# Patient Record
Sex: Female | Born: 1955
Health system: Southern US, Community
[De-identification: ages and names within clinical notes are randomized; demographics above are authoritative.]

## PROBLEM LIST (undated history)

## (undated) DIAGNOSIS — K7581 Nonalcoholic steatohepatitis (NASH): Secondary | ICD-10-CM

## (undated) DIAGNOSIS — L439 Lichen planus, unspecified: Secondary | ICD-10-CM

## (undated) DIAGNOSIS — E063 Autoimmune thyroiditis: Principal | ICD-10-CM

## (undated) DIAGNOSIS — E559 Vitamin D deficiency, unspecified: Secondary | ICD-10-CM

## (undated) DIAGNOSIS — E038 Other specified hypothyroidism: Secondary | ICD-10-CM

## (undated) DIAGNOSIS — R748 Abnormal levels of other serum enzymes: Secondary | ICD-10-CM

## (undated) DIAGNOSIS — E049 Nontoxic goiter, unspecified: Secondary | ICD-10-CM

## (undated) HISTORY — DX: Abnormal levels of other serum enzymes: R74.8

## (undated) HISTORY — DX: Vitamin D deficiency, unspecified: E55.9

## (undated) HISTORY — DX: Autoimmune thyroiditis: E03.8

## (undated) HISTORY — DX: Nonalcoholic steatohepatitis (NASH): K75.81

## (undated) HISTORY — DX: Autoimmune thyroiditis: E06.3

## (undated) HISTORY — DX: Lichen planus, unspecified: L43.9

## (undated) HISTORY — DX: Nontoxic goiter, unspecified: E04.9

---

## 1996-10-07 HISTORY — PX: TOTAL ABDOMINAL HYSTERECTOMY W/ BILATERAL SALPINGOOPHORECTOMY: SHX83

## 1998-10-07 HISTORY — PX: CHOLECYSTECTOMY: SHX55

## 2016-02-06 LAB — HM MAMMOGRAPHY

## 2017-12-19 ENCOUNTER — Ambulatory Visit: Payer: Self-pay | Admitting: Family Medicine

## 2017-12-19 ENCOUNTER — Encounter: Payer: Self-pay | Admitting: Family Medicine

## 2017-12-19 ENCOUNTER — Ambulatory Visit: Payer: BLUE CROSS/BLUE SHIELD | Admitting: Family Medicine

## 2017-12-19 VITALS — BP 134/82 | HR 92 | Temp 98.6°F | Ht 63.0 in | Wt 280.2 lb

## 2017-12-19 DIAGNOSIS — E063 Autoimmune thyroiditis: Secondary | ICD-10-CM

## 2017-12-19 DIAGNOSIS — Z1231 Encounter for screening mammogram for malignant neoplasm of breast: Secondary | ICD-10-CM

## 2017-12-19 DIAGNOSIS — L439 Lichen planus, unspecified: Secondary | ICD-10-CM | POA: Diagnosis not present

## 2017-12-19 DIAGNOSIS — E038 Other specified hypothyroidism: Secondary | ICD-10-CM | POA: Diagnosis not present

## 2017-12-19 DIAGNOSIS — R740 Nonspecific elevation of levels of transaminase and lactic acid dehydrogenase [LDH]: Secondary | ICD-10-CM | POA: Diagnosis not present

## 2017-12-19 DIAGNOSIS — R748 Abnormal levels of other serum enzymes: Secondary | ICD-10-CM

## 2017-12-19 DIAGNOSIS — E049 Nontoxic goiter, unspecified: Secondary | ICD-10-CM

## 2017-12-19 DIAGNOSIS — K7581 Nonalcoholic steatohepatitis (NASH): Secondary | ICD-10-CM | POA: Diagnosis not present

## 2017-12-19 DIAGNOSIS — F329 Major depressive disorder, single episode, unspecified: Secondary | ICD-10-CM | POA: Diagnosis not present

## 2017-12-19 DIAGNOSIS — F32A Depression, unspecified: Secondary | ICD-10-CM | POA: Insufficient documentation

## 2017-12-19 DIAGNOSIS — Z1239 Encounter for other screening for malignant neoplasm of breast: Secondary | ICD-10-CM

## 2017-12-19 HISTORY — DX: Depression, unspecified: F32.A

## 2017-12-19 NOTE — Progress Notes (Signed)
Subjective:    Patient ID: Krista Kelley, female    DOB: [birth year], 62 y.o.   MRN: [id number]  HPI Chief Complaint  Patient presents with  . other    establish care   She is new to the practice and here for a complete physical exam. Previous medical care: in [place], [place] for 5 months. [place] prior to that. Living in [place] now.  Last CPE: less than a year ago  Other providers: none  States she has history of elevated liver enzymes, NASH  States she was diagnosed with Hashimoto's thyroiditis in 2004 and started treatment in 2016. States she had an [place] of thyroid in 2004.  States she thinks her thyroid has enlarged and she occasionally has discomfort when swallowing and thinks it is related to her thyroid.  Reports her dermatologist in [place] diagnosed her with lichen planus on her arms and in her mouth only occasionally- takes Zyrtec for itching.  States her hands, feet and legs hurt when her lichen planus flares up. Once every 4-6 weeks. Flare up lasts 1-2 weeks.   Takes Effexor for depression and has been on this medication since 1994.  States her mood is good  Denies fever, chills, dizziness, chest pain, palpitations, shortness of breath, abdominal pain, N/V/D, urinary symptoms, LE edema.    Social history: Lives with her husband and son who is 33, worked as a Garment/textile technologist alcohol rarely.   States she knows she needs to lose weight.  States her previous PCP mentioned this often.  Her diet has been the same for years as well has her weight.  She does not exercise.   Health maintenance:  Mammogram: last one in 2017. Overdue.  Requests that I send her for mammogram. Colonoscopy: never and she does not want to have a colonoscopy. Last Gynecological Exam: hysterectomy.  Pelvic exam less than a year ago.   Reviewed allergies, medications, past medical, surgical, family, and social history.    Review of Systems Pertinent positives and negatives in the history of  present illness.     Objective:   Physical Exam BP 134/82 (BP Location: Left Arm, Patient Position: Sitting)   Pulse 92   Temp 98.6 F (37 C)   Ht 5' 3"  (1.6 m)   Wt 280 lb 3.2 oz (127.1 kg)   SpO2 94%   BMI 49.64 kg/m   Alert and in no distress. Marland Kitchen Pharyngeal area is normal. Neck is supple without adenopathy. Thyroid is enlarged, no obvious nodules, nontender. Cardiac exam shows a regular sinus rhythm without murmurs or gallops. Lungs are clear to auscultation.  Extremities without edema, normal pulses.  Skin is warm and dry, no pallor or jaundice.  PERRLA, normal conjunctiva.       Assessment & Plan:  /Hypothyroidism due to Hashimoto's thyroiditis - Plan: CBC with Differential/Platelet, Comprehensive metabolic panel, T4, free, TSH, US THYROID  Lichen planus  Enlarged thyroid - Plan: CBC with Differential/Platelet, T4, free, TSH, US THYROID  NASH (nonalcoholic steatohepatitis) - Plan: CBC with Differential/Platelet, Comprehensive metabolic panel  Elevated liver enzymes - Plan: Comprehensive metabolic panel  Depression, unspecified depression type - Plan: CBC with Differential/Platelet, Comprehensive metabolic panel  Screening for breast cancer - Plan: MM DIGITAL SCREENING BILATERAL  Morbid obesity ([place])  Plan to send her for thyroid ultrasound and check thyroid panel due to Hashimoto's thyroiditis history and enlarged thyroid.  She feels like her thyroid has increased in size and now notices issues with swallowing.  If needed we  will refer her to endocrinology.  Once I have her thyroid panel results are will refill Synthroid and adjust medication as appropriate. History of [place] and elevated liver enzymes.   Counseled her on healthy diet and weight loss.  She does not exercise.  Discussed that she can lose weight by cutting back calories and carbohydrates even without exercise.  She does not drink. Depression appears to be well controlled and she has been on Effexor for many  years without any issues.  She will continue this. She had a CPE in [place] within the past year however she moved prior to getting her mammogram.  I will send her for a mammogram. She will sign a medical release today and we will attempt to get medical records from providers in [place]. We will address lichen planus at follow-up visit.  Consider referral to dermatologist if she continues having flareups. We will check labs and follow-up. Spent at least 45 minutes face-to-face with patient and 50% or greater was in coordination of care and counseling.

## 2017-12-19 NOTE — Patient Instructions (Signed)
Call and schedule your mammogram.   We will call you with your lab results.

## 2017-12-20 LAB — CBC WITH DIFFERENTIAL/PLATELET
Basophils Absolute: 0.1 10*3/uL (ref 0.0–0.2)
Basos: 1 %
EOS (ABSOLUTE): 0.3 10*3/uL (ref 0.0–0.4)
Eos: 4 %
Hematocrit: 43.8 % (ref 34.0–46.6)
Hemoglobin: 14.8 g/dL (ref 11.1–15.9)
Immature Grans (Abs): 0 10*3/uL (ref 0.0–0.1)
Immature Granulocytes: 0 %
Lymphocytes Absolute: 1.9 10*3/uL (ref 0.7–3.1)
Lymphs: 25 %
MCH: 29.7 pg (ref 26.6–33.0)
MCHC: 33.8 g/dL (ref 31.5–35.7)
MCV: 88 fL (ref 79–97)
Monocytes Absolute: 0.5 10*3/uL (ref 0.1–0.9)
Monocytes: 6 %
Neutrophils Absolute: 4.9 10*3/uL (ref 1.4–7.0)
Neutrophils: 64 %
Platelets: 170 10*3/uL (ref 150–379)
RBC: 4.98 x10E6/uL (ref 3.77–5.28)
RDW: 14 % (ref 12.3–15.4)
WBC: 7.6 10*3/uL (ref 3.4–10.8)

## 2017-12-20 LAB — COMPREHENSIVE METABOLIC PANEL
ALT: 33 IU/L — ABNORMAL HIGH (ref 0–32)
AST: 36 IU/L (ref 0–40)
Albumin/Globulin Ratio: 1.4 (ref 1.2–2.2)
Albumin: 4.2 g/dL (ref 3.5–5.5)
Alkaline Phosphatase: 104 IU/L (ref 39–117)
BUN/Creatinine Ratio: 15 (ref 9–23)
BUN: 9 mg/dL (ref 6–24)
Bilirubin Total: 0.9 mg/dL (ref 0.0–1.2)
CO2: 23 mmol/L (ref 20–29)
Calcium: 9.9 mg/dL (ref 8.7–10.2)
Chloride: 105 mmol/L (ref 96–106)
Creatinine, Ser: 0.6 mg/dL (ref 0.57–1.00)
GFR calc Af Amer: 115 mL/min/{1.73_m2} (ref >59)
GFR calc non Af Amer: 100 mL/min/{1.73_m2} (ref >59)
Globulin, Total: 3 g/dL (ref 1.5–4.5)
Glucose: 92 mg/dL (ref 65–99)
Potassium: 4 mmol/L (ref 3.5–5.2)
Sodium: 146 mmol/L — ABNORMAL HIGH (ref 134–144)
Total Protein: 7.2 g/dL (ref 6.0–8.5)

## 2017-12-20 LAB — TSH: TSH: 3.09 u[IU]/mL (ref 0.450–4.500)

## 2017-12-20 LAB — T4, FREE: Free T4: 1.26 ng/dL (ref 0.82–1.77)

## 2017-12-24 ENCOUNTER — Telehealth: Payer: Self-pay | Admitting: Family Medicine

## 2017-12-24 ENCOUNTER — Ambulatory Visit (HOSPITAL_COMMUNITY): Payer: BLUE CROSS/BLUE SHIELD

## 2017-12-24 MED ORDER — LEVOTHYROXINE SODIUM 50 MCG PO TABS: 50.0000 ug | ORAL_TABLET | Freq: Every day | ORAL | 1 refills | Status: DC

## 2017-12-24 NOTE — Telephone Encounter (Signed)
New Message  Pt verbalized she is wanting for nurse to give her a call.  Please f/u

## 2017-12-24 NOTE — Telephone Encounter (Signed)
Pt called to state that she has an 2500 deducitble and can not have her thyroid us done right now. Pt asked for a refill on thryoid med which I sent to pharmacy

## 2017-12-25 ENCOUNTER — Encounter: Payer: Self-pay | Admitting: Family Medicine

## 2017-12-25 ENCOUNTER — Telehealth: Payer: Self-pay | Admitting: Family Medicine

## 2017-12-25 LAB — SPECIMEN STATUS REPORT

## 2017-12-25 LAB — HEPATITIS PANEL, ACUTE
Hep A IgM: NEGATIVE
Hep B C IgM: NEGATIVE
Hep C Virus Ab: <0.1 s/co ratio (ref 0.0–0.9)
Hepatitis B Surface Ag: NEGATIVE

## 2017-12-25 MED ORDER — VENLAFAXINE HCL 75 MG PO TABS: ORAL_TABLET | ORAL | 1 refills | Status: DC

## 2017-12-25 NOTE — Telephone Encounter (Signed)
Received records from [facility]. Only received small amount with no colonoscopy. Krista Kelley was called to inquire and was informed that pt was only seen once for a office visit and never received a colonoscopy at that facility. Sending back records received for review.

## 2017-12-29 ENCOUNTER — Telehealth: Payer: Self-pay | Admitting: Family Medicine

## 2017-12-29 NOTE — Telephone Encounter (Signed)
Requested records from [facility] received.Sending back for review.

## 2017-12-31 ENCOUNTER — Telehealth: Payer: Self-pay | Admitting: Family Medicine

## 2017-12-31 NOTE — Telephone Encounter (Signed)
Received requested records from Dr. Awilda Metro, Dermatology. Sending back for review.

## 2018-01-02 ENCOUNTER — Encounter: Payer: Self-pay | Admitting: Gastroenterology

## 2018-01-02 ENCOUNTER — Other Ambulatory Visit: Payer: Self-pay | Admitting: Family Medicine

## 2018-01-02 DIAGNOSIS — K7581 Nonalcoholic steatohepatitis (NASH): Secondary | ICD-10-CM

## 2018-01-02 DIAGNOSIS — R748 Abnormal levels of other serum enzymes: Secondary | ICD-10-CM

## 2018-01-05 ENCOUNTER — Encounter: Payer: Self-pay | Admitting: Family Medicine

## 2018-01-08 ENCOUNTER — Ambulatory Visit
Admission: RE | Admit: 2018-01-08 | Discharge: 2018-01-08 | Disposition: A | Discharge status: Home / Self Care | Payer: BLUE CROSS/BLUE SHIELD | Source: Ambulatory Visit | Attending: Family Medicine | Admitting: Family Medicine

## 2018-01-08 DIAGNOSIS — Z1239 Encounter for other screening for malignant neoplasm of breast: Secondary | ICD-10-CM

## 2018-01-08 DIAGNOSIS — Z1231 Encounter for screening mammogram for malignant neoplasm of breast: Secondary | ICD-10-CM | POA: Diagnosis not present

## 2018-01-11 ENCOUNTER — Encounter: Payer: Self-pay | Admitting: Family Medicine

## 2018-01-16 ENCOUNTER — Encounter: Payer: Self-pay | Admitting: Family Medicine

## 2018-01-19 ENCOUNTER — Encounter: Payer: Self-pay | Admitting: Family Medicine

## 2018-01-27 ENCOUNTER — Other Ambulatory Visit: Payer: Self-pay | Admitting: Family Medicine

## 2018-01-27 DIAGNOSIS — R928 Other abnormal and inconclusive findings on diagnostic imaging of breast: Secondary | ICD-10-CM

## 2018-02-02 ENCOUNTER — Ambulatory Visit
Admission: RE | Admit: 2018-02-02 | Discharge: 2018-02-02 | Disposition: A | Discharge status: Home / Self Care | Payer: BLUE CROSS/BLUE SHIELD | Source: Ambulatory Visit | Attending: Family Medicine | Admitting: Family Medicine

## 2018-02-02 ENCOUNTER — Other Ambulatory Visit: Payer: Self-pay | Admitting: Family Medicine

## 2018-02-02 DIAGNOSIS — R928 Other abnormal and inconclusive findings on diagnostic imaging of breast: Secondary | ICD-10-CM | POA: Diagnosis not present

## 2018-02-02 DIAGNOSIS — N6310 Unspecified lump in the right breast, unspecified quadrant: Secondary | ICD-10-CM

## 2018-02-16 ENCOUNTER — Ambulatory Visit: Payer: BLUE CROSS/BLUE SHIELD | Admitting: Gastroenterology

## 2018-03-17 ENCOUNTER — Encounter: Payer: Self-pay | Admitting: Family Medicine

## 2018-06-22 ENCOUNTER — Other Ambulatory Visit: Payer: Self-pay | Admitting: Family Medicine

## 2018-07-30 ENCOUNTER — Telehealth: Payer: Self-pay | Admitting: Family Medicine

## 2018-07-30 NOTE — Telephone Encounter (Signed)
Pt called for refills on Effexor. Due to insurance needs to be 75 mg 3x daily. She would like to change to extended release. So she is requested venlafaxine extended release 75 mg 3 times daily. Please send to [facility]. Pt can be reached at [phone].

## 2018-07-30 NOTE — Telephone Encounter (Signed)
Please take care of this however, XR is only once daily dosing and not three times daily. She will need a follow up in the next 3 months, I have only seen her once in March.

## 2018-07-30 NOTE — Telephone Encounter (Signed)
Pt will come in Monday to have a med check and discuss med and then possible UTI

## 2018-08-03 ENCOUNTER — Encounter: Payer: Self-pay | Admitting: Family Medicine

## 2018-08-03 ENCOUNTER — Ambulatory Visit: Payer: BLUE CROSS/BLUE SHIELD | Admitting: Family Medicine

## 2018-08-03 ENCOUNTER — Other Ambulatory Visit: Payer: Self-pay

## 2018-08-03 ENCOUNTER — Encounter: Payer: Self-pay | Admitting: *Deleted

## 2018-08-03 VITALS — BP 140/90 | HR 89 | Temp 98.1°F | Resp 16 | Wt 278.2 lb

## 2018-08-03 DIAGNOSIS — F329 Major depressive disorder, single episode, unspecified: Secondary | ICD-10-CM | POA: Diagnosis not present

## 2018-08-03 DIAGNOSIS — E063 Autoimmune thyroiditis: Secondary | ICD-10-CM

## 2018-08-03 DIAGNOSIS — R350 Frequency of micturition: Secondary | ICD-10-CM

## 2018-08-03 DIAGNOSIS — E038 Other specified hypothyroidism: Secondary | ICD-10-CM | POA: Diagnosis not present

## 2018-08-03 DIAGNOSIS — Z91030 Bee allergy status: Secondary | ICD-10-CM

## 2018-08-03 DIAGNOSIS — M545 Low back pain, unspecified: Secondary | ICD-10-CM

## 2018-08-03 DIAGNOSIS — K7581 Nonalcoholic steatohepatitis (NASH): Secondary | ICD-10-CM

## 2018-08-03 DIAGNOSIS — R748 Abnormal levels of other serum enzymes: Secondary | ICD-10-CM | POA: Diagnosis not present

## 2018-08-03 DIAGNOSIS — E559 Vitamin D deficiency, unspecified: Secondary | ICD-10-CM

## 2018-08-03 DIAGNOSIS — F32A Depression, unspecified: Secondary | ICD-10-CM

## 2018-08-03 DIAGNOSIS — E870 Hyperosmolality and hypernatremia: Secondary | ICD-10-CM

## 2018-08-03 HISTORY — DX: Vitamin D deficiency, unspecified: E55.9

## 2018-08-03 LAB — POCT URINALYSIS DIP (PROADVANTAGE DEVICE)
Bilirubin, UA: NEGATIVE
Blood, UA: NEGATIVE
Glucose, UA: NEGATIVE mg/dL
Ketones, POC UA: NEGATIVE mg/dL
Leukocytes, UA: NEGATIVE
Nitrite, UA: NEGATIVE
Protein Ur, POC: NEGATIVE mg/dL
Specific Gravity, Urine: 1.025
pH, UA: 6 (ref 5.0–8.0)

## 2018-08-03 MED ORDER — EPINEPHRINE 0.3 MG/0.3ML IJ SOAJ: 0.3000 mg | Freq: Once | INTRAMUSCULAR | 0 refills | Status: DC

## 2018-08-03 MED ORDER — VENLAFAXINE HCL 75 MG PO TABS: 75.0000 mg | ORAL_TABLET | Freq: Two times a day (BID) | ORAL | 0 refills | Status: DC

## 2018-08-03 MED ORDER — EPINEPHRINE 0.3 MG/0.3ML IJ SOAJ: 0.3000 mg | Freq: Once | INTRAMUSCULAR | 0 refills | Status: AC

## 2018-08-03 MED ORDER — VENLAFAXINE HCL 37.5 MG PO TABS: 37.5000 mg | ORAL_TABLET | Freq: Two times a day (BID) | ORAL | 0 refills | Status: DC

## 2018-08-03 NOTE — Progress Notes (Signed)
Subjective:    Patient ID: Krista Kelley, female    DOB: [birth year], 62 y.o.   MRN: [id number]  HPI Chief Complaint  Patient presents with  . med check    med check and UTI- frequency, stomach pain, low back pain- gotten better   She is here for a medication check.   Depression- reports being on Effexor for years and stopped it cold [place] 1 1/2 years. States this did not go well so she started back on it. Would like to wean off the medication.  States she tried Zoloft, Lexapro, Wellbutrin.  Has never been in counseling in the past. Is not opposed to it.   States last week she had lower abdominal pain, urinary frequency, urgency.  Symptoms have improved significantly since then.   States she loves juice and soda. Drinks plenty of water as well.   History of hypothyroidism and is taking daily levothyroxine on an empty stomach. No side effects. Needs recheck.   History of elevated LFTs and NASH. Diagnosed by her GI out of state prior to moving here. Has not yet established with GI here.  Denies alcohol or NSAID use.  Denies fever, chills, dizziness, chest pain, palpitations, shortness of breath,  N/V/D, LE edema.   Rash and itching of bilateral forearm. Is seeing a dermatologist. [facility]. Coy Saunas at [facility].  Joint pain and forearm rash once monthly. Lasting 4-5 days.  No joint swelling.   Requests vitamin D testing. Is taking a supplement due to vitamin D def in past.   Carries an epi-pen for bee allergy and requests refill.  Reviewed allergies, medications, past medical, surgical, family, and social history.   Review of Systems Pertinent positives and negatives in the history of present illness.     Objective:   Physical Exam BP 140/90   Pulse 89   Temp 98.1 F (36.7 C) (Oral)   Resp 16   Wt 278 lb 3.2 oz (126.2 kg)   SpO2 98%   BMI 49.28 kg/m   Alert and in no distress.  Pharyngeal area is normal. Neck is supple without adenopathy or thyromegaly. Cardiac exam shows a  regular sinus rhythm without murmurs or gallops. Lungs are clear to auscultation. Abdomen is soft, obese, unable to perform a full exam due to body habitus. Extremities without edema, pulses intact. Skin is warm and dry, no jaundice or pallor. Sclera anicteric. PERRLA, EOMs intact. CNs intact.        Assessment & Plan:  Depression, unspecified depression type  Frequency of urination - Plan: POCT Urinalysis DIP (Proadvantage Device)  Right-sided low back pain without sciatica, unspecified chronicity - Plan: POCT Urinalysis DIP (Proadvantage Device)  Hypothyroidism due to Hashimoto's thyroiditis - Plan: CBC with Differential/Platelet, Comprehensive metabolic panel, TSH  Vitamin D deficiency - Plan: VITAMIN D 25 Hydroxy (Vit-D Deficiency, Fractures)  NASH (nonalcoholic steatohepatitis) - Plan: Hemoglobin A1c  Morbid obesity ([place]) - Plan: CBC with Differential/Platelet, Comprehensive metabolic panel, VITAMIN D 25 Hydroxy (Vit-D Deficiency, Fractures), Hemoglobin A1c, Lipid panel  Elevated liver enzymes - Plan: CBC with Differential/Platelet, Comprehensive metabolic panel, Lipid panel  Serum sodium elevated - Plan: Comprehensive metabolic panel  Bee sting allergy - Plan: DISCONTINUED: EPINEPHrine 0.3 mg/0.3 mL IJ SOAJ injection  She would like to wean off Effexor therefore we are prescribing both the 75 mg and 37.5 mg doses for her. Counseling done on how to wean off and a list of therapists was given. She agrees to call and schedule.  Does not appear to  be any danger to herself or others.  UA unremarkable and symptoms have resolved. No further testing needed.  Hypothyroidism- recheck of thyroid function. Adjust dose as needed.  Vitamin D def- check vitamin D level and adjust medication as appropriate.  Morbid obesity- counseling on healthy diet and exercise. She needs more aggressive treatment at this time. Interested in [place] weight loss study and they will contact her. Also  discussed options for long term medication, Saxenda or referral to [facility]. She will let me know.  NASH- recheck hepatic function. Advised to establish with GI or send her for a follow up US. She prefers to see GI before having another [place].  Follow up pending labs.  Prescription given for epi-pens due to allergy.  Spent at least 40 minutes face to face with patient and more than 50% was in counseling and coordination of care.

## 2018-08-03 NOTE — Patient Instructions (Addendum)
You may start weaning off the Effexor if you would like as discussed. You could start with 75 mg twice daily for 1 to 2 weeks, then 75 mg in the morning and 37.5 mg in the evening. You could then switch to 37.5 mg twice daily for a week and then cut back to once daily dosing.  The me know if you have any questions. I think it would be a great idea for you to call and schedule with a counselor.  We will call you with your lab results.  You will receive a phone call from [facility].  If you decide you do not want to be a part of the study then let me know and we will try an alternative plan for weight loss.  You can call to schedule your appointment with the counselor. A few offices are listed below for you to call.      [facility] [address] [place], [place] [address] [phone]   [facility]  [place], [place], [place] [address]  Phone: [phone]   [place] [address] [place], [place] [address]  Phone: [phone]  [facility]  [address] [place], [place] [address] Phone: [phone]

## 2018-08-04 ENCOUNTER — Encounter: Payer: Self-pay | Admitting: Family Medicine

## 2018-08-04 LAB — CBC WITH DIFFERENTIAL/PLATELET
Basophils Absolute: 0.1 10*3/uL (ref 0.0–0.2)
Basos: 1 %
EOS (ABSOLUTE): 0.2 10*3/uL (ref 0.0–0.4)
Eos: 3 %
Hematocrit: 42.3 % (ref 34.0–46.6)
Hemoglobin: 15.1 g/dL (ref 11.1–15.9)
Immature Grans (Abs): 0 10*3/uL (ref 0.0–0.1)
Immature Granulocytes: 0 %
Lymphocytes Absolute: 1.5 10*3/uL (ref 0.7–3.1)
Lymphs: 20 %
MCH: 30.9 pg (ref 26.6–33.0)
MCHC: 35.7 g/dL (ref 31.5–35.7)
MCV: 87 fL (ref 79–97)
Monocytes Absolute: 0.7 10*3/uL (ref 0.1–0.9)
Monocytes: 9 %
Neutrophils Absolute: 4.9 10*3/uL (ref 1.4–7.0)
Neutrophils: 67 %
Platelets: 174 10*3/uL (ref 150–450)
RBC: 4.88 x10E6/uL (ref 3.77–5.28)
RDW: 13.1 % (ref 12.3–15.4)
WBC: 7.3 10*3/uL (ref 3.4–10.8)

## 2018-08-04 LAB — COMPREHENSIVE METABOLIC PANEL
ALT: 24 IU/L (ref 0–32)
AST: 31 IU/L (ref 0–40)
Albumin/Globulin Ratio: 1.3 (ref 1.2–2.2)
Albumin: 4.1 g/dL (ref 3.6–4.8)
Alkaline Phosphatase: 91 IU/L (ref 39–117)
BUN/Creatinine Ratio: 18 (ref 12–28)
BUN: 10 mg/dL (ref 8–27)
Bilirubin Total: 0.8 mg/dL (ref 0.0–1.2)
CO2: 21 mmol/L (ref 20–29)
Calcium: 9.5 mg/dL (ref 8.7–10.3)
Chloride: 106 mmol/L (ref 96–106)
Creatinine, Ser: 0.56 mg/dL — ABNORMAL LOW (ref 0.57–1.00)
GFR calc Af Amer: 117 mL/min/{1.73_m2} (ref >59)
GFR calc non Af Amer: 102 mL/min/{1.73_m2} (ref >59)
Globulin, Total: 3.2 g/dL (ref 1.5–4.5)
Glucose: 72 mg/dL (ref 65–99)
Potassium: 3.8 mmol/L (ref 3.5–5.2)
Sodium: 143 mmol/L (ref 134–144)
Total Protein: 7.3 g/dL (ref 6.0–8.5)

## 2018-08-04 LAB — TSH: TSH: 3 u[IU]/mL (ref 0.450–4.500)

## 2018-08-04 LAB — HEMOGLOBIN A1C
Est. average glucose Bld gHb Est-mCnc: 111 mg/dL
Hgb A1c MFr Bld: 5.5 % (ref 4.8–5.6)

## 2018-08-04 LAB — LIPID PANEL
Chol/HDL Ratio: 4.5 ratio — ABNORMAL HIGH (ref 0.0–4.4)
Cholesterol, Total: 196 mg/dL (ref 100–199)
HDL: 44 mg/dL (ref >39)
LDL Calculated: 129 mg/dL — ABNORMAL HIGH (ref 0–99)
Triglycerides: 117 mg/dL (ref 0–149)
VLDL Cholesterol Cal: 23 mg/dL (ref 5–40)

## 2018-08-04 LAB — VITAMIN D 25 HYDROXY (VIT D DEFICIENCY, FRACTURES): Vit D, 25-Hydroxy: 31.1 ng/mL (ref 30.0–100.0)

## 2018-08-04 MED ORDER — VENLAFAXINE HCL ER 37.5 MG PO CP24: ORAL_CAPSULE | ORAL | 0 refills | Status: DC

## 2018-08-04 MED ORDER — VENLAFAXINE HCL ER 75 MG PO CP24: ORAL_CAPSULE | ORAL | 0 refills | Status: DC

## 2018-08-05 ENCOUNTER — Ambulatory Visit
Admission: RE | Admit: 2018-08-05 | Discharge: 2018-08-05 | Disposition: A | Discharge status: Home / Self Care | Payer: BLUE CROSS/BLUE SHIELD | Source: Ambulatory Visit | Attending: Family Medicine | Admitting: Family Medicine

## 2018-08-05 ENCOUNTER — Other Ambulatory Visit: Payer: Self-pay | Admitting: Family Medicine

## 2018-08-05 DIAGNOSIS — N6310 Unspecified lump in the right breast, unspecified quadrant: Secondary | ICD-10-CM

## 2018-08-05 DIAGNOSIS — N6011 Diffuse cystic mastopathy of right breast: Secondary | ICD-10-CM | POA: Diagnosis not present

## 2018-08-05 DIAGNOSIS — R928 Other abnormal and inconclusive findings on diagnostic imaging of breast: Secondary | ICD-10-CM | POA: Diagnosis not present

## 2018-08-28 ENCOUNTER — Telehealth: Payer: Self-pay | Admitting: Family Medicine

## 2018-08-28 MED ORDER — VENLAFAXINE HCL ER 75 MG PO CP24: ORAL_CAPSULE | ORAL | 1 refills | Status: DC

## 2018-08-28 NOTE — Telephone Encounter (Signed)
Spoke to patient and she is taking 37.5mg  and doing great on it but a couple times a month see needs an extra dose due to stress.  Per vickie, med doesn't work like that and we can just up her to 75mg  XR if she feels that this can help her. She can not take any extra dose or cut them in half. If she continues to have stress then she will need to be seen or visit a counselor to talk about stress. I have send in 75mg  xr capsule to pharmacy.

## 2018-08-28 NOTE — Telephone Encounter (Signed)
Refill 37.39m? And pt is still to take it every other day or take it every day?

## 2018-08-28 NOTE — Telephone Encounter (Signed)
Left message for pt to call me back  Per notes, she is suppose to being weaning off med.

## 2018-08-28 NOTE — Telephone Encounter (Signed)
Please advise 

## 2018-08-28 NOTE — Telephone Encounter (Signed)
Please clarify how she is taking it but if she is not weaning off she should be taking it daily. Ask whether 37.5 mg XR or 75 mg XR

## 2018-08-28 NOTE — Telephone Encounter (Signed)
Please call and clarify what she needs and take care of this. Thanks.

## 2018-08-28 NOTE — Telephone Encounter (Signed)
Pt called and is requesting to a refill on her Effexor XR she would like to get it to where she takes 2 37.5 a day, states she is doing good on the 37.5 but some days she needs 75, so she wants to be able to have enough on the refill, and would like a 3 month supply,she states she can not come back in at the time because her insurnace will not cover any office visit at the time, pt would like the medicine sent to the [place], [place] and pt can be reached at [phone]

## 2018-08-28 NOTE — Telephone Encounter (Signed)
Patient she is suppose to be weaning off this medication however at this time she don't think she will be able to. Please advise further.

## 2018-08-28 NOTE — Telephone Encounter (Signed)
Ok to refill 

## 2018-08-31 ENCOUNTER — Ambulatory Visit: Payer: BLUE CROSS/BLUE SHIELD | Admitting: Family Medicine

## 2019-02-08 ENCOUNTER — Other Ambulatory Visit: Payer: Self-pay | Admitting: Family Medicine

## 2019-02-08 ENCOUNTER — Other Ambulatory Visit: Payer: BLUE CROSS/BLUE SHIELD

## 2019-02-08 ENCOUNTER — Telehealth: Payer: Self-pay | Admitting: Internal Medicine

## 2019-02-08 DIAGNOSIS — N6310 Unspecified lump in the right breast, unspecified quadrant: Secondary | ICD-10-CM

## 2019-02-08 DIAGNOSIS — Z1239 Encounter for other screening for malignant neoplasm of breast: Secondary | ICD-10-CM

## 2019-02-08 NOTE — Telephone Encounter (Signed)
Pt called and states that she had mammo today and ultrasound done today but had to cancel this. Order was placed for MM diagnotic bilateral and [place] for right breast. However insurance wants her to pay over $600 because she would have a diagnotic done on left side and nothing has been found yet. She called to ask that we switch to a regular screening mammogram for both breasts, do a diagnotic screening just on the right side and then do Korea of right side. I have put in these orders for pt. And she will call and schedule herself.   Ive advised pt that usually when they find something, it will always been diagnotic going forward but her insurance won't for left side since nothing was found. FYI

## 2019-02-09 ENCOUNTER — Telehealth: Payer: BLUE CROSS/BLUE SHIELD | Admitting: Physician Assistant

## 2019-02-09 DIAGNOSIS — R3911 Hesitancy of micturition: Secondary | ICD-10-CM

## 2019-02-09 DIAGNOSIS — R300 Dysuria: Secondary | ICD-10-CM | POA: Diagnosis not present

## 2019-02-09 DIAGNOSIS — R350 Frequency of micturition: Secondary | ICD-10-CM

## 2019-02-09 MED ORDER — CEPHALEXIN 500 MG PO CAPS: 500.0000 mg | ORAL_CAPSULE | Freq: Two times a day (BID) | ORAL | 0 refills | Status: AC

## 2019-02-09 NOTE — Progress Notes (Signed)
We are sorry that you are not feeling well.  Here is how we plan to help!  Based on what you shared with me it looks like you most likely have a simple urinary tract infection.  A UTI (Urinary Tract Infection) is a bacterial infection of the bladder.  Most cases of urinary tract infections are simple to treat but a key part of your care is to encourage you to drink plenty of fluids and watch your symptoms carefully.  I have prescribed Keflex 500 mg twice a day for 7 days.  Your symptoms should gradually improve. Call Krista Kelley if the burning in your urine worsens, you develop worsening fever, back pain or pelvic pain or if your symptoms do not resolve after completing the antibiotic.  Urinary tract infections can be prevented by drinking plenty of water to keep your body hydrated.  Also be sure when you wipe, wipe from front to back and don't hold it in!  If possible, empty your bladder every 4 hours.  Your e-visit answers were reviewed by a board certified advanced clinical practitioner to complete your personal care plan.  Depending on the condition, your plan could have included both over the counter or prescription medications.  If there is a problem please reply once you have received a response from your provider.  Your safety is important to Krista Kelley.  If you have drug allergies check your prescription carefully.    You can use MyChart to ask questions about today's visit, request a non-urgent call back, or ask for a work or school excuse for 24 hours related to this e-Visit. If it has been greater than 24 hours you will need to follow up with your provider, or enter a new e-Visit to address those concerns.   You will get an e-mail in the next two days asking about your experience.  I hope that your e-visit has been valuable and will speed your recovery. Thank you for using e-visits.    ===View-only below this line===   ----- Message -----    From: Remi Haggard    Sent: 02/09/2019  9:51 AM  EDT      To: E-Visit Mailing List Subject: E-Visit Submission: Urinary Problems  E-Visit Submission: Urinary Problems --------------------------------  Question: Which of the following are you experiencing? Answer:   Difficulty passing urine  Question: Are you able to pass urine? Answer:   Yes, I can pass urine.  Question: How long have you had pain or difficulty passing urine? Answer:   More  than two days but less than one week  Question: Do you have a fever? Answer:   No, I do not have a fever  Question: Do you have any of the following? Answer:   I have none of these problems  Question: Do you have an exaggerated sensation of the need to pass urine? Answer:   Yes, the sensation is exaggerated  Question: Do you have the urge to urinate more of less frequently than normal? Answer:   More frequently  Question: What does your urine look like? Answer:   It is cloudy  Question: Do you have any of the following? Answer:   None of the above  Question: Do you have any of the following? Answer:   No discharge  Question: Do you have any sores on your genitals? Answer:   No  Question: Do you have any history of kidney dysfunction or kidney problems? Answer:   No  Question: Within the past 3 months,  have you had any surgery on your kidneys or bladder, or have you had a tube inserted to collect your urine? Answer:   No, I have never had either  Question: Have you had similar symptoms in the past? Answer:   Yes, I have had similar symptoms more than once before  Question: If you had similar symptoms in the past, did any of the following work? Answer:   Pills for urine infection  Question: Please list any additional comments  Answer:   I have been taking OTC AZO for urinary relief.  It gives me some relief while I take it, but I have taken the recommended amount and the symptoms are returning.  I feel like I need to urinate constantly, but then there is just a trickle.  While  taking the AZO, I can actually pass the urine fine.  I have had leg and hip area pain off and on.  Question: Please list your medication allergies that you may have ? (If 'none' , please list as 'none') Answer:   None  A total of 5-10 minutes was spent evaluating this patients questionnaire and formulating a plan of care.

## 2019-02-26 ENCOUNTER — Telehealth: Payer: BLUE CROSS/BLUE SHIELD | Admitting: Nurse Practitioner

## 2019-02-26 ENCOUNTER — Other Ambulatory Visit: Payer: Self-pay

## 2019-02-26 ENCOUNTER — Ambulatory Visit: Payer: BLUE CROSS/BLUE SHIELD | Admitting: Medical

## 2019-02-26 ENCOUNTER — Encounter: Payer: Self-pay | Admitting: Medical

## 2019-02-26 VITALS — BP 150/90 | HR 102 | Temp 98.2°F | Resp 18 | Ht 63.0 in | Wt 275.6 lb

## 2019-02-26 DIAGNOSIS — R350 Frequency of micturition: Secondary | ICD-10-CM | POA: Diagnosis not present

## 2019-02-26 DIAGNOSIS — R030 Elevated blood-pressure reading, without diagnosis of hypertension: Secondary | ICD-10-CM

## 2019-02-26 DIAGNOSIS — R3915 Urgency of urination: Secondary | ICD-10-CM | POA: Diagnosis not present

## 2019-02-26 DIAGNOSIS — N390 Urinary tract infection, site not specified: Secondary | ICD-10-CM

## 2019-02-26 DIAGNOSIS — R300 Dysuria: Secondary | ICD-10-CM

## 2019-02-26 HISTORY — DX: Frequency of micturition: R35.0

## 2019-02-26 HISTORY — DX: Elevated blood-pressure reading, without diagnosis of hypertension: R03.0

## 2019-02-26 HISTORY — DX: Dysuria: R30.0

## 2019-02-26 LAB — POCT URINALYSIS DIP (PROADVANTAGE DEVICE)
Bilirubin, UA: NEGATIVE
Glucose, UA: NEGATIVE mg/dL
Ketones, POC UA: NEGATIVE mg/dL
Leukocytes, UA: NEGATIVE
Nitrite, UA: NEGATIVE
Protein Ur, POC: NEGATIVE mg/dL
Specific Gravity, Urine: 1.015
Urobilinogen, Ur: 1
pH, UA: 6 (ref 5.0–8.0)

## 2019-02-26 MED ORDER — FLUCONAZOLE 150 MG PO TABS: ORAL_TABLET | ORAL | 0 refills | Status: DC

## 2019-02-26 MED ORDER — SULFAMETHOXAZOLE-TRIMETHOPRIM 800-160 MG PO TABS: 1.0000 | ORAL_TABLET | Freq: Two times a day (BID) | ORAL | 0 refills | Status: DC

## 2019-02-26 NOTE — Progress Notes (Signed)
Subjective:   Krista Kelley is a 63 y.o. female who complains of possible urinary tract infection.  She notes just having the same symptoms of UTI a few weeks ago.  Just finished an antibiotic within the last 2 weeks.  She did a ED visit was put on Keflex for the urinary tract infection 3 to 4 weeks ago.  Her symptoms then and now include urinary frequency, urgency, some hesitancy and burning with urination.  Some lower belly pain.  No blood in the urine.  No fever no body aches no sweats no chills.  No vaginal discharge.  She has status post complete hysterectomy.  Married x40 years, no concerns for STD.  No other complaint   Past Medical History:  Diagnosis Date  . Elevated liver enzymes   . Enlarged thyroid   . Hypothyroidism due to Hashimoto's thyroiditis   . Lichen planus   . NASH (nonalcoholic steatohepatitis)   . Vitamin D deficiency 08/03/2018    Current Outpatient Medications on File Prior to Visit  Medication Sig Dispense Refill  . Cetirizine HCl (ZYRTEC PO) Take by mouth.    . Homeopathic Products (LIVER SUPPORT SL) Place under the tongue.    Marland Kitchen. levothyroxine (SYNTHROID, LEVOTHROID) 50 MCG tablet TAKE 1 TABLET DAILY BEFORE BREAKFAST 90 tablet 4  . Probiotic Product (DIGESTIVE ADVANTAGE PO) Take 1 tablet by mouth.    . TURMERIC CURCUMIN PO Take 500 mg by mouth.    . venlafaxine XR (EFFEXOR XR) 75 MG 24 hr capsule Take 1 tablet daily. 90 capsule 1  . Vitamin D, Ergocalciferol, 2000 units CAPS Take 1 tablet by mouth every other day.     No current facility-administered medications on file prior to visit.     ROS as in subjective  Reviewed allergies, medications, past medical, surgical, and social history.     Objective: BP (!) 150/90   Pulse (!) 102   Temp 98.2 F (36.8 C) (Oral)   Resp 18   Ht 5\' 3"  (1.6 m)   Wt 275 lb 9.6 oz (125 kg)   SpO2 97%   BMI 48.82 kg/m   Wt Readings from Last 3 Encounters:  02/26/19 275 lb 9.6 oz (125 kg)  08/03/18 278 lb 3.2 oz (126.2  kg)  12/19/17 280 lb 3.2 oz (127.1 kg)    General appearance: alert, no distress, WD/WN, female Abdomen: +bs, soft, non tender, non distended, no masses, no hepatomegaly, no splenomegaly, no bruits Back: no CVA tenderness GU: deferred       Urinalysis    Component Value Date/Time   LABSPEC 1.015 02/26/2019 1142   BILIRUBINUR negative 02/26/2019 1142   KETONESUR negative 02/26/2019 1142   PROTEINUR negative 02/26/2019 1142   NITRITE Negative 02/26/2019 1142   LEUKOCYTESUR Negative 02/26/2019 1142     Assessment: Encounter Diagnoses  Name Primary?  . Recurrent UTI Yes  . Dysuria   . Urinary frequency   . Urinary urgency   . Elevated blood-pressure reading without diagnosis of hypertension      Plan: Discussed symptoms, diagnosis or urinary tract infection, possible complications, and usual course of illness.  Begin medication bactrim.  Advised increased water intake, can use OTC Tylenol for pain prn.   Urine culture sent: Yes    Diflucan prescribed just in case secondary yeast infection occurs.  Discussed symptoms that would prompt her to use Diflucan.  Follow-up with Krista Kelley regarding elevated blood pressure  Follow-up pending culture.    Krista Kelley was seen today for uti.  Diagnoses and all orders for this visit:  Recurrent UTI -     Urine Culture  Dysuria -     POCT Urinalysis DIP (Proadvantage Device) -     Urine Culture  Urinary frequency -     Urine Culture  Urinary urgency -     Urine Culture  Elevated blood-pressure reading without diagnosis of hypertension  Other orders -     sulfamethoxazole-trimethoprim (BACTRIM DS) 800-160 MG tablet; Take 1 tablet by mouth 2 (two) times daily. -     fluconazole (DIFLUCAN) 150 MG tablet; Take 1 now, may repeat in 1 week   Call or return if worse or not improving in the next 3-4 days.

## 2019-02-26 NOTE — Progress Notes (Signed)
Based on what you shared with me it looks like you have recurrent Urinary tract infection,that should be evaluated in a face to face office visit. You will need a urinalysis and urine culture.   NOTE: If you entered your credit card information for this eVisit, you will not be charged. You may see a "hold" on your card for the $30 but that hold will drop off and you will not have a charge processed.  If you are having a true medical emergency please call 911.  If you need an urgent face to face visit, [place] has four urgent care centers for your convenience.  If you need care fast and have a high deductible or no insurance consider:   [email or web address] to reserve your spot online an avoid wait times  [facility] [address], [address] [place], [place] [address] 8 am to 8 pm Monday-Friday 10 am to 4 pm Saturday-Sunday *Across the street from [employer]  [address] [place], [address] 8 am to 5 pm Monday-Friday * In the [facility] on the [facility]   The following sites will take your  insurance:  . [facility]  [phone] Get Driving Directions Find a Provider at this Location  [address] [place], [place] [address] . 10 am to 8 pm Monday-Friday . 12 pm to 8 pm Saturday-Sunday   . [facility]  [phone] Get Driving Directions Find a Provider at this Location  [address] [place], [place] [address] . 8 am to 8 pm Monday-Friday . 9 am to 6 pm Saturday . 11 am to 6 pm Sunday   . [facility] at [facility]  [phone] Get Driving Directions  [address] [place], [place] [address] . 8 am to 8 pm Monday-Friday . 8 am to 4 pm Saturday-Sunday   Your e-visit answers were reviewed by a board certified advanced clinical practitioner to complete your personal care plan.

## 2019-02-27 LAB — URINE CULTURE: Organism ID, Bacteria: NO GROWTH

## 2019-03-05 ENCOUNTER — Ambulatory Visit: Payer: BLUE CROSS/BLUE SHIELD

## 2019-03-05 ENCOUNTER — Ambulatory Visit
Admission: RE | Admit: 2019-03-05 | Discharge: 2019-03-05 | Disposition: A | Discharge status: Home / Self Care | Payer: BLUE CROSS/BLUE SHIELD | Source: Ambulatory Visit | Attending: Family Medicine | Admitting: Family Medicine

## 2019-03-05 ENCOUNTER — Other Ambulatory Visit: Payer: Self-pay

## 2019-03-05 DIAGNOSIS — R928 Other abnormal and inconclusive findings on diagnostic imaging of breast: Secondary | ICD-10-CM | POA: Diagnosis not present

## 2019-03-05 DIAGNOSIS — N6310 Unspecified lump in the right breast, unspecified quadrant: Secondary | ICD-10-CM

## 2019-07-18 ENCOUNTER — Other Ambulatory Visit: Payer: Self-pay | Admitting: Family Medicine

## 2019-07-19 NOTE — Telephone Encounter (Signed)
Only ok to give her 30 days since she has not had thyroid level checked since last year. She will need a visit in office with labs or a virtual visit and labs.

## 2019-07-19 NOTE — Telephone Encounter (Signed)
[facility] to fill pt levothyroxine. Please advise [facility]

## 2019-07-19 NOTE — Telephone Encounter (Signed)
Pt schedule for cp e10/22

## 2019-07-29 ENCOUNTER — Encounter: Payer: BLUE CROSS/BLUE SHIELD | Admitting: Family Medicine

## 2019-09-10 ENCOUNTER — Telehealth: Payer: Self-pay

## 2019-09-10 NOTE — Telephone Encounter (Signed)
Pt. Has been rescheduled for 09/22/19

## 2019-09-10 NOTE — Telephone Encounter (Signed)
Pt. Called because she has an upcoming apt. On Monday 09/13/19 for a med check but on Monday 09/06/19 was around her daughter in law for about 1 hr in her house and now her daughter in law has tested positive for Corona Virus. Krista Kelley said she is not having any symptoms at this time. She wanted to know if she was still ok to come in Monday or does she need to reschedule.

## 2019-09-10 NOTE — Telephone Encounter (Signed)
Per CDC guidelines, she should quarantine for 14 days after an exposure to [place]. We will need to re-schedule. Thanks.

## 2019-09-13 ENCOUNTER — Encounter: Payer: BC Managed Care – PPO | Admitting: Family Medicine

## 2019-09-22 ENCOUNTER — Ambulatory Visit: Payer: BC Managed Care – PPO | Admitting: Family Medicine

## 2019-09-22 ENCOUNTER — Encounter: Payer: Self-pay | Admitting: Family Medicine

## 2019-09-22 ENCOUNTER — Other Ambulatory Visit: Payer: Self-pay

## 2019-09-22 VITALS — BP 130/80 | HR 75 | Temp 97.5°F | Wt 269.0 lb

## 2019-09-22 DIAGNOSIS — E063 Autoimmune thyroiditis: Secondary | ICD-10-CM

## 2019-09-22 DIAGNOSIS — K7581 Nonalcoholic steatohepatitis (NASH): Secondary | ICD-10-CM | POA: Diagnosis not present

## 2019-09-22 DIAGNOSIS — F329 Major depressive disorder, single episode, unspecified: Secondary | ICD-10-CM

## 2019-09-22 DIAGNOSIS — E7800 Pure hypercholesterolemia, unspecified: Secondary | ICD-10-CM | POA: Insufficient documentation

## 2019-09-22 DIAGNOSIS — E559 Vitamin D deficiency, unspecified: Secondary | ICD-10-CM

## 2019-09-22 DIAGNOSIS — Z79899 Other long term (current) drug therapy: Secondary | ICD-10-CM | POA: Diagnosis not present

## 2019-09-22 DIAGNOSIS — E038 Other specified hypothyroidism: Secondary | ICD-10-CM

## 2019-09-22 DIAGNOSIS — F32A Depression, unspecified: Secondary | ICD-10-CM

## 2019-09-22 NOTE — Progress Notes (Signed)
   Subjective:    Patient ID: Krista Kelley, female    DOB: 09-03-1956, 63 y.o.   MRN: [id number]  HPI Chief Complaint  Patient presents with  . med check    med check,    Here today for a medication management visit.  Reports feeling well today.   Hypothyroidism- has been stable on current dose of levothyroxine. Needs TSH level checked.  Depression- she has been on Effexor XR and doing well. No concerns with her mood.   Hx of elevated BP but today is normal.   History of vitamin D deficiency and is taking a daily supplement.   History of NASH. Does not have a GI here. She was referred and received a call from [facility] to schedule. We are monitoring her liver function tests.   Elevated LDL- she is fasting and requests recheck of lipids.   Declines flu shot.    Review of Systems Pertinent positives and negatives in the history of present illness.     Objective:   Physical Exam BP 130/80   Pulse 75   Temp (!) 97.5 F (36.4 C)   Wt 269 lb (122 kg)   SpO2 97%   BMI 47.65 kg/m   Alert and in no distress. Neck is supple without adenopathy or thyromegaly. Cardiac exam shows a regular rhythm without murmurs or gallops. Lungs are clear to auscultation. Skin is warm and dry. Mood is good.       Assessment & Plan:  Hypothyroidism due to Hashimoto's thyroiditis - Plan: TSH, T4, Free -appears asymptomatic. Check TSH and refill medication as appropriate.   NASH (nonalcoholic steatohepatitis) - Plan: CBC with Differential, Comprehensive metabolic panel -refer to [facility]. She will call to schedule. Check LFTs.   Morbid obesity ([place]) - Plan: CBC with Differential, Comprehensive metabolic panel, TSH -congratulated her on recent weight loss and encouraged her to continue.   Vitamin D deficiency- Vitamin D, 25-hydroxy, check vitamin D level and follow up. Adjust supplement as appropriate   Medication management - Plan: TSH, T4, Free, Vitamin D, 25-hydroxy  Elevated LDL  cholesterol level - Plan: Lipid Panel -check lipids and follow up  Depression, unspecified depression type -mood is doing well on Effexor XR, continue

## 2019-09-22 NOTE — Patient Instructions (Signed)
It was a pleasure seeing you today. Keep up the good work with weight loss.   I recommend that you call [facility] to schedule an appointment.   Continue on your current medications and I will be in touch with your lab results.   Follow up for a complete physical exam at your convenience.

## 2019-09-23 LAB — LIPID PANEL
Chol/HDL Ratio: 4.2 ratio (ref 0.0–4.4)
Cholesterol, Total: 214 mg/dL — ABNORMAL HIGH (ref 100–199)
HDL: 51 mg/dL (ref >39)
LDL Chol Calc (NIH): 142 mg/dL — ABNORMAL HIGH (ref 0–99)
Triglycerides: 116 mg/dL (ref 0–149)
VLDL Cholesterol Cal: 21 mg/dL (ref 5–40)

## 2019-09-23 LAB — COMPREHENSIVE METABOLIC PANEL
ALT: 24 IU/L (ref 0–32)
AST: 24 IU/L (ref 0–40)
Albumin/Globulin Ratio: 1.7 (ref 1.2–2.2)
Albumin: 4.4 g/dL (ref 3.8–4.8)
Alkaline Phosphatase: 114 IU/L (ref 39–117)
BUN/Creatinine Ratio: 19 (ref 12–28)
BUN: 12 mg/dL (ref 8–27)
Bilirubin Total: 0.7 mg/dL (ref 0.0–1.2)
CO2: 24 mmol/L (ref 20–29)
Calcium: 9.9 mg/dL (ref 8.7–10.3)
Chloride: 105 mmol/L (ref 96–106)
Creatinine, Ser: 0.63 mg/dL (ref 0.57–1.00)
GFR calc Af Amer: 112 mL/min/{1.73_m2} (ref >59)
GFR calc non Af Amer: 97 mL/min/{1.73_m2} (ref >59)
Globulin, Total: 2.6 g/dL (ref 1.5–4.5)
Glucose: 87 mg/dL (ref 65–99)
Potassium: 4.9 mmol/L (ref 3.5–5.2)
Sodium: 144 mmol/L (ref 134–144)
Total Protein: 7 g/dL (ref 6.0–8.5)

## 2019-09-23 LAB — CBC WITH DIFFERENTIAL/PLATELET
Basophils Absolute: 0.1 10*3/uL (ref 0.0–0.2)
Basos: 1 %
EOS (ABSOLUTE): 0.2 10*3/uL (ref 0.0–0.4)
Eos: 3 %
Hematocrit: 44.1 % (ref 34.0–46.6)
Hemoglobin: 15 g/dL (ref 11.1–15.9)
Immature Grans (Abs): 0 10*3/uL (ref 0.0–0.1)
Immature Granulocytes: 0 %
Lymphocytes Absolute: 1.5 10*3/uL (ref 0.7–3.1)
Lymphs: 25 %
MCH: 29.9 pg (ref 26.6–33.0)
MCHC: 34 g/dL (ref 31.5–35.7)
MCV: 88 fL (ref 79–97)
Monocytes Absolute: 0.5 10*3/uL (ref 0.1–0.9)
Monocytes: 8 %
Neutrophils Absolute: 3.7 10*3/uL (ref 1.4–7.0)
Neutrophils: 63 %
Platelets: 138 10*3/uL — ABNORMAL LOW (ref 150–450)
RBC: 5.01 x10E6/uL (ref 3.77–5.28)
RDW: 12.9 % (ref 11.7–15.4)
WBC: 5.9 10*3/uL (ref 3.4–10.8)

## 2019-09-23 LAB — TSH: TSH: 3.23 u[IU]/mL (ref 0.450–4.500)

## 2019-09-23 LAB — VITAMIN D 25 HYDROXY (VIT D DEFICIENCY, FRACTURES): Vit D, 25-Hydroxy: 27.6 ng/mL — ABNORMAL LOW (ref 30.0–100.0)

## 2019-09-23 LAB — T4, FREE: Free T4: 1.04 ng/dL (ref 0.82–1.77)

## 2019-09-24 ENCOUNTER — Other Ambulatory Visit: Payer: Self-pay | Admitting: Internal Medicine

## 2019-09-24 MED ORDER — LEVOTHYROXINE SODIUM 50 MCG PO TABS: 50.0000 ug | ORAL_TABLET | Freq: Every day | ORAL | 0 refills | Status: DC

## 2019-09-27 ENCOUNTER — Encounter: Payer: Self-pay | Admitting: Family Medicine

## 2019-09-28 MED ORDER — LEVOTHYROXINE SODIUM 50 MCG PO TABS: 50.0000 ug | ORAL_TABLET | Freq: Every day | ORAL | 0 refills | Status: DC

## 2019-09-28 MED ORDER — VENLAFAXINE HCL ER 75 MG PO CP24: ORAL_CAPSULE | ORAL | 0 refills | Status: DC

## 2019-10-12 ENCOUNTER — Telehealth: Payer: Self-pay

## 2019-10-12 MED ORDER — LEVOTHYROXINE SODIUM 50 MCG PO TABS: 50.0000 ug | ORAL_TABLET | Freq: Every day | ORAL | 0 refills | Status: DC

## 2019-10-12 MED ORDER — LEVOTHYROXINE SODIUM 50 MCG PO TABS: 50.0000 ug | ORAL_TABLET | Freq: Every day | ORAL | 1 refills | Status: DC

## 2019-10-12 NOTE — Telephone Encounter (Signed)
Sent meds to pharmacy

## 2019-10-12 NOTE — Telephone Encounter (Signed)
Pt. Called stating that she has still never received her [facility]othy[facility]ine from [facility] I told her it was sent in on 09/28/19 and she told me that [facility] contacted her and told her that we had denied her medication, pt. Stats she has been without her levothyroxine and would like [place] to resend her 90 day supply to [facility], but would also a 30 day supply called in to the [facility] in [place] at [facility] [address] [facility] [address] In the mean time.

## 2019-11-18 ENCOUNTER — Encounter: Payer: BC Managed Care – PPO | Admitting: Family Medicine

## 2020-02-01 ENCOUNTER — Other Ambulatory Visit: Payer: Self-pay | Admitting: Family Medicine

## 2020-02-01 DIAGNOSIS — N6310 Unspecified lump in the right breast, unspecified quadrant: Secondary | ICD-10-CM

## 2020-03-07 ENCOUNTER — Telehealth: Payer: BC Managed Care – PPO | Admitting: Physician Assistant

## 2020-03-07 DIAGNOSIS — R399 Unspecified symptoms and signs involving the genitourinary system: Secondary | ICD-10-CM

## 2020-03-07 MED ORDER — NITROFURANTOIN MONOHYD MACRO 100 MG PO CAPS: 100.0000 mg | ORAL_CAPSULE | Freq: Two times a day (BID) | ORAL | 0 refills | Status: AC

## 2020-03-07 NOTE — Progress Notes (Signed)
We are sorry that you are not feeling well.  Here is how we plan to help!  Based on what you shared with me it looks like you most likely have a simple urinary tract infection.  A UTI (Urinary Tract Infection) is a bacterial infection of the bladder.  Most cases of urinary tract infections are simple to treat but a key part of your care is to encourage you to drink plenty of fluids and watch your symptoms carefully.  I have prescribed MacroBid 100 mg twice a day for 5 days.  Your symptoms should gradually improve. Call us if the burning in your urine worsens, you develop worsening fever, back pain or pelvic pain or if your symptoms do not resolve after completing the antibiotic.  Urinary tract infections can be prevented by drinking plenty of water to keep your body hydrated.  Also be sure when you wipe, wipe from front to back and don't hold it in!  If possible, empty your bladder every 4 hours.  Your e-visit answers were reviewed by a board certified advanced clinical practitioner to complete your personal care plan.  Depending on the condition, your plan could have included both over the counter or prescription medications.  If there is a problem please reply  once you have received a response from your provider.  Your safety is important to [place].  If you have drug allergies check your prescription carefully.    You can use MyChart to ask questions about today's visit, request a non-urgent call back, or ask for a work or school excuse for 24 hours related to this e-Visit. If it has been greater than 24 hours you will need to follow up with your provider, or enter a new e-Visit to address those concerns.   You will get an e-mail in the next two days asking about your experience.  I hope that your e-visit has been valuable and will speed your recovery. Thank you for using e-visits.   Greater than 5 minutes, yet less than 10 minutes of time have been spent researching, coordinating and  implementing care for this patient today.

## 2020-03-08 ENCOUNTER — Ambulatory Visit: Payer: BLUE CROSS/BLUE SHIELD

## 2020-03-08 ENCOUNTER — Ambulatory Visit
Admission: RE | Admit: 2020-03-08 | Discharge: 2020-03-08 | Disposition: A | Discharge status: Home / Self Care | Payer: BC Managed Care – PPO | Source: Ambulatory Visit | Attending: Family Medicine | Admitting: Family Medicine

## 2020-03-08 ENCOUNTER — Other Ambulatory Visit: Payer: Self-pay

## 2020-03-08 DIAGNOSIS — R928 Other abnormal and inconclusive findings on diagnostic imaging of breast: Secondary | ICD-10-CM | POA: Diagnosis not present

## 2020-03-08 DIAGNOSIS — N6310 Unspecified lump in the right breast, unspecified quadrant: Secondary | ICD-10-CM

## 2020-05-15 ENCOUNTER — Encounter: Payer: Self-pay | Admitting: Family Medicine

## 2020-05-28 ENCOUNTER — Other Ambulatory Visit: Payer: Self-pay | Admitting: Family Medicine

## 2020-08-01 ENCOUNTER — Ambulatory Visit: Payer: BC Managed Care – PPO | Admitting: Family Medicine

## 2020-08-01 ENCOUNTER — Other Ambulatory Visit: Payer: Self-pay

## 2020-08-01 ENCOUNTER — Encounter: Payer: Self-pay | Admitting: Family Medicine

## 2020-08-01 VITALS — BP 130/80 | HR 78 | Temp 97.8°F

## 2020-08-01 DIAGNOSIS — M25661 Stiffness of right knee, not elsewhere classified: Secondary | ICD-10-CM

## 2020-08-01 DIAGNOSIS — G8929 Other chronic pain: Secondary | ICD-10-CM

## 2020-08-01 DIAGNOSIS — M25561 Pain in right knee: Secondary | ICD-10-CM

## 2020-08-01 NOTE — Progress Notes (Signed)
   Subjective:    Patient ID: Krista Kelley, female    DOB: 1956-02-25, 64 y.o.   MRN: [id number]  HPI Chief Complaint  Patient presents with  . knee pain    right knee pain- last week. pain at knee cap. needs refill on effexor- no depression issues   Complains of intermittent and worsening right knee pain for the past year. States her knee gave away with her when she was walking down the steps 2 weeks ago. States she just sat down and was not injured. Pain is worse and now having issues with swelling and ROM.  States she has difficulty walking.   Using ice and Biofreeze. Taking Aleve 1-2 times per day along with Tylenol.  States she has been using a knee sleeve for stability.   No other arthralgias or myalgias.   Denies fever, chills, dizziness, chest pain, palpitations, shortness of breath, abdominal pain, N/V/D, urinary symptoms, LE edema.   Reviewed allergies, medications, past medical, surgical, family, and social history.    Review of Systems Pertinent positives and negatives in the history of present illness.     Objective:   Physical Exam Constitutional:      General: She is not in acute distress.    Appearance: Normal appearance. She is not ill-appearing.  Musculoskeletal:     Right knee: Swelling present. Decreased range of motion. Tenderness present over the medial joint line.     Left knee: Normal.     Comments: Unable to perform a full exam due to her pain and swelling. RLE is neurovascularly intact.   Skin:    General: Skin is warm and dry.  Neurological:     General: No focal deficit present.     Mental Status: She is alert and oriented to person, place, and time.     Sensory: No sensory deficit.     Motor: No weakness.     Gait: Gait abnormal.    BP 130/80   Pulse 78   Temp 97.8 F (36.6 C)        Assessment & Plan:  Chronic pain of right knee - Plan: Ambulatory referral to Orthopedic Surgery  Decreased ROM of right knee - Plan: Ambulatory  referral to Orthopedic Surgery  Prefers to wait on XR until she is evaluated by ortho.  Recommend she continue with conservative treatment and use caution to avoid falling.  Continue with ice, topical analgesia and oral pain medication. May also continue with compression and elevation.  Follow up with orthopedist.

## 2020-08-07 ENCOUNTER — Ambulatory Visit: Payer: BC Managed Care – PPO | Admitting: Orthopaedic Surgery

## 2020-08-07 ENCOUNTER — Ambulatory Visit: Payer: Self-pay

## 2020-08-07 ENCOUNTER — Other Ambulatory Visit: Payer: Self-pay

## 2020-08-07 ENCOUNTER — Telehealth: Payer: Self-pay

## 2020-08-07 ENCOUNTER — Encounter: Payer: Self-pay | Admitting: Orthopaedic Surgery

## 2020-08-07 VITALS — Ht 62.5 in | Wt 267.0 lb

## 2020-08-07 DIAGNOSIS — M25561 Pain in right knee: Secondary | ICD-10-CM

## 2020-08-07 DIAGNOSIS — M1711 Unilateral primary osteoarthritis, right knee: Secondary | ICD-10-CM

## 2020-08-07 MED ORDER — METHYLPREDNISOLONE ACETATE 40 MG/ML IJ SUSP
40.0000 mg | INTRAMUSCULAR | Status: AC | PRN
  Administered 2020-08-07: 40 mg via INTRA_ARTICULAR

## 2020-08-07 MED ORDER — LIDOCAINE HCL 1 % IJ SOLN
3.0000 mL | INTRAMUSCULAR | Status: AC | PRN
  Administered 2020-08-07: 3 mL

## 2020-08-07 NOTE — Telephone Encounter (Signed)
Right knee gel injection

## 2020-08-07 NOTE — Progress Notes (Signed)
Office Visit Note   Patient: Krista Kelley           Date of Birth: [birth year]           MRN: [id number] Visit Date: 08/07/2020              Requested by: Girtha Rm, NP-C [place],  [place] [address] PCP: Girtha Rm, NP-C   Assessment & Plan: Visit Diagnoses:  1. Right knee pain, unspecified chronicity   2. Unilateral primary osteoarthritis, right knee   3. Acute pain of right knee     Plan: I did place an injection in her right knee today with lidocaine mixed with Depo-Medrol.  She tolerated this very well and felt instant relief of her pain.  She is a perfect candidate for hyaluronic acid for the right knee and I explained this to her in detail.  I did talk about weight loss as well.  All question concerns were answered and addressed.  We will see her back in 4 weeks to see how she is doing overall and to hopefully place hyaluronic acid into the right knee.  Follow-Up Instructions: Return in about 4 weeks (around 09/04/2020).   Orders:  Orders Placed This Encounter  Procedures  . Large Joint Inj  . XR Knee 1-2 Views Right   No orders of the defined types were placed in this encounter.     Procedures: Large Joint Inj: R knee on 08/07/2020 3:13 PM Indications: diagnostic evaluation and pain Details: 22 G 1.5 in needle, superolateral approach  Arthrogram: No  Medications: 3 mL lidocaine 1 %; 40 mg methylPREDNISolone acetate 40 MG/ML Outcome: tolerated well, no immediate complications Procedure, treatment alternatives, risks and benefits explained, specific risks discussed. Consent was given by the patient. Immediately prior to procedure a time out was called to verify the correct patient, procedure, equipment, support staff and site/side marked as required. Patient was prepped and draped in the usual sterile fashion.       Clinical Data: No additional findings.   Subjective: Chief Complaint  Patient presents with  . Right Knee - Pain    The patient comes in today for evaluation treatment of acute right knee pain.  She has no known injury.  She has medial joint line tenderness is where she points to.  She was recently on a trip where she had to be in the car for long period time and is started hurting quite a bit after that.  She has tried Tylenol and Aleve and those things have not helped.  She is never had surgery on that knee.  She is now diabetic.  She denies any acute injury.  Her only other medical issue is that she is obese with a BMI of 48.  HPI  Review of Systems She currently denies any headache, chest pain, shortness of breath, fever, chills, nausea, vomiting  Objective: Vital Signs: Ht 5' 2.5" (1.588 m)   Wt 267 lb (121.1 kg)   BMI 48.06 kg/m   Physical Exam She is alert and oriented x3 and in no acute distress Ortho Exam Examination of her right knee shows medial joint line tenderness and varus malalignment that is correctable.  She has good range of motion of that knee and it is ligamentously stable.  The medial joint line tenderness is significant along the medial tibial plateau as well.  There is just a very mild effusion. Specialty Comments:  No specialty comments available.  Imaging: XR Knee 1-2  Views Right  Result Date: 08/07/2020 2 views of the right knee show moderate to severe tricompartment arthritis with medial joint space narrowing in particular osteophytes.  There is significant patellofemoral disease and varus malalignment.    PMFS History: Patient Active Problem List   Diagnosis Date Noted  . Elevated LDL cholesterol level 09/22/2019  . Elevated blood-pressure reading without diagnosis of hypertension 02/26/2019  . Urinary frequency 02/26/2019  . Dysuria 02/26/2019  . Vitamin D deficiency 08/03/2018  . Morbid obesity ([place]) 12/19/2017  . Depression 12/19/2017  . Hypothyroidism due to Hashimoto's thyroiditis   . Lichen planus   . Enlarged thyroid   . NASH (nonalcoholic  steatohepatitis)   . Elevated liver enzymes    Past Medical History:  Diagnosis Date  . Elevated liver enzymes   . Enlarged thyroid   . Hypothyroidism due to Hashimoto's thyroiditis   . Lichen planus   . NASH (nonalcoholic steatohepatitis)   . Vitamin D deficiency 08/03/2018    Family History  Problem Relation Age of Onset  . Breast cancer Neg Hx     Past Surgical History:  Procedure Laterality Date  . CESAREAN SECTION     x3  . CHOLECYSTECTOMY  2000  . TOTAL ABDOMINAL HYSTERECTOMY W/ BILATERAL SALPINGOOPHORECTOMY  1998   fibroids   Social History   Occupational History  . Not on file  Tobacco Use  . Smoking status: Never Smoker  . Smokeless tobacco: Never Used  Substance and Sexual Activity  . Alcohol use: No  . Drug use: No  . Sexual activity: Yes    Partners: Male    Birth control/protection: None

## 2020-08-10 ENCOUNTER — Telehealth: Payer: Self-pay

## 2020-08-10 NOTE — Telephone Encounter (Signed)
Submitted VOB, SynviscOne, right knee.

## 2020-08-10 NOTE — Telephone Encounter (Signed)
Noted  

## 2020-08-12 ENCOUNTER — Encounter: Payer: Self-pay | Admitting: Family Medicine

## 2020-08-14 MED ORDER — VENLAFAXINE HCL ER 75 MG PO CP24: ORAL_CAPSULE | ORAL | 0 refills | Status: DC

## 2020-08-28 ENCOUNTER — Other Ambulatory Visit: Payer: Self-pay | Admitting: Family Medicine

## 2020-09-04 ENCOUNTER — Telehealth: Payer: Self-pay

## 2020-09-04 ENCOUNTER — Ambulatory Visit: Payer: BC Managed Care – PPO | Admitting: Orthopaedic Surgery

## 2020-09-04 NOTE — Telephone Encounter (Signed)
There is nothing we can really recommend other than occasional steroid injections in that knee and that the worst case scenario a knee replacement.

## 2020-09-04 NOTE — Telephone Encounter (Signed)
I spoke with the pt and she still wants to come in Monday and be seen. I gave her self pay cost for the gel injections. Pt is considering doing this. She wants to talk to dr. Ninfa Linden first.

## 2020-09-04 NOTE — Telephone Encounter (Signed)
SynviscOne, is not a covered product by patient's insurance, ([employer]).  Please advise on next option for patient.  Thank you.

## 2020-09-07 ENCOUNTER — Telehealth: Payer: Self-pay | Admitting: Orthopaedic Surgery

## 2020-09-07 ENCOUNTER — Telehealth: Payer: Self-pay

## 2020-09-07 NOTE — Telephone Encounter (Signed)
Called and talked to pt. She is interested in [facility]. I have submitted her information online and they should call her today. I informed the pt to let me know if she hasnt heard from them by the end of the day.

## 2020-09-07 NOTE — Telephone Encounter (Signed)
Patient called requesting a call back from [place].. Patient returning her call. Please call patient at [phone].

## 2020-09-07 NOTE — Telephone Encounter (Signed)
Called pt and she stated she paid for the Trivisc and they should be contacting us soon to inform us of this

## 2020-09-11 ENCOUNTER — Ambulatory Visit (INDEPENDENT_AMBULATORY_CARE_PROVIDER_SITE_OTHER): Payer: BC Managed Care – PPO | Admitting: Orthopaedic Surgery

## 2020-09-11 ENCOUNTER — Encounter: Payer: Self-pay | Admitting: Orthopaedic Surgery

## 2020-09-11 DIAGNOSIS — N63 Unspecified lump in unspecified breast: Secondary | ICD-10-CM

## 2020-09-11 DIAGNOSIS — F419 Anxiety disorder, unspecified: Secondary | ICD-10-CM

## 2020-09-11 DIAGNOSIS — T782XXA Anaphylactic shock, unspecified, initial encounter: Secondary | ICD-10-CM | POA: Insufficient documentation

## 2020-09-11 DIAGNOSIS — M1711 Unilateral primary osteoarthritis, right knee: Secondary | ICD-10-CM | POA: Diagnosis not present

## 2020-09-11 HISTORY — DX: Unspecified lump in unspecified breast: N63.0

## 2020-09-11 HISTORY — DX: Anxiety disorder, unspecified: F41.9

## 2020-09-11 MED ORDER — LIDOCAINE HCL 1 % IJ SOLN
0.5000 mL | INTRAMUSCULAR | Status: AC | PRN
  Administered 2020-09-11: .5 mL

## 2020-09-11 NOTE — Progress Notes (Signed)
   Procedure Note  Patient: Krista Kelley             Date of Birth: [birth year]           MRN: [id number]             Visit Date: 09/11/2020   HPI: Krista Kelley comes in today for Tivisc injection.  She is paper the medicine out of pocket.  She has known osteoarthritis right knee.  She states that the last cortisone injection did help with her knee pain.  She has had no new injuries.  Physical exam: Right knee no abnormal warmth erythema.  Slight effusion.  Overall good range of motion.   Procedures: Visit Diagnoses:  1. Unilateral primary osteoarthritis, right knee     Large Joint Inj: R knee on 09/11/2020 11:12 AM Indications: pain Details: 22 G 1.5 in needle, superolateral approach  Arthrogram: No  Medications: 0.5 mL lidocaine 1 % Aspirate: 16 mL yellow Outcome: tolerated well, no immediate complications Procedure, treatment alternatives, risks and benefits explained, specific risks discussed. Consent was given by the patient. Immediately prior to procedure a time out was called to verify the correct patient, procedure, equipment, support staff and site/side marked as required. Patient was prepped and draped in the usual sterile fashion.    Plan: We will see her back in 1 week for her second Trivisc injection.  Questions were encouraged and answered by Dr. Ninfa Linden and myself.

## 2020-09-20 ENCOUNTER — Encounter: Payer: Self-pay | Admitting: Orthopaedic Surgery

## 2020-09-20 ENCOUNTER — Ambulatory Visit (INDEPENDENT_AMBULATORY_CARE_PROVIDER_SITE_OTHER): Payer: BC Managed Care – PPO | Admitting: Orthopaedic Surgery

## 2020-09-20 DIAGNOSIS — M1711 Unilateral primary osteoarthritis, right knee: Secondary | ICD-10-CM

## 2020-09-20 NOTE — Progress Notes (Signed)
   Procedure Note  Patient: Krista Kelley             Date of Birth: May 18, 1956           MRN: [id number]             Visit Date: 09/20/2020  Procedures: Visit Diagnoses:  1. Unilateral primary osteoarthritis, right knee     Large Joint Inj: R knee on 09/20/2020 3:43 PM Indications: diagnostic evaluation and pain Details: 22 G 1.5 in needle, superolateral approach  Arthrogram: No  Outcome: tolerated well, no immediate complications Procedure, treatment alternatives, risks and benefits explained, specific risks discussed. Consent was given by the patient. Immediately prior to procedure a time out was called to verify the correct patient, procedure, equipment, support staff and site/side marked as required. Patient was prepped and draped in the usual sterile fashion.    The patient comes in today for injection #2 of a series of 3 and hyaluronic injections in the right knee to treat the pain from osteoarthritis.  She had no adverse reaction to injection #1 and does feel better overall.  Examination of her right knee shows just a mild effusion but no redness.  She is moving it well.  I did place injection #2 of a series of 3 hyaluronic acid injections into the right knee which she tolerated well.  We will see her back next week for injection #3.  We placed Trivisc

## 2020-09-22 ENCOUNTER — Telehealth: Payer: BC Managed Care – PPO | Admitting: Emergency Medicine

## 2020-09-22 DIAGNOSIS — R300 Dysuria: Secondary | ICD-10-CM | POA: Diagnosis not present

## 2020-09-22 MED ORDER — CEPHALEXIN 500 MG PO CAPS: 500.0000 mg | ORAL_CAPSULE | Freq: Two times a day (BID) | ORAL | 0 refills | Status: DC

## 2020-09-22 NOTE — Progress Notes (Signed)

## 2020-09-27 ENCOUNTER — Ambulatory Visit (INDEPENDENT_AMBULATORY_CARE_PROVIDER_SITE_OTHER): Payer: BC Managed Care – PPO | Admitting: Orthopaedic Surgery

## 2020-09-27 ENCOUNTER — Encounter: Payer: Self-pay | Admitting: Orthopaedic Surgery

## 2020-09-27 ENCOUNTER — Other Ambulatory Visit: Payer: Self-pay

## 2020-09-27 DIAGNOSIS — M1711 Unilateral primary osteoarthritis, right knee: Secondary | ICD-10-CM | POA: Diagnosis not present

## 2020-09-27 NOTE — Progress Notes (Signed)
   Procedure Note  Patient: Krista Kelley             Date of Birth: 1956-03-01           MRN: [id number]             Visit Date: 09/27/2020  Procedures: Visit Diagnoses:  1. Unilateral primary osteoarthritis, right knee     Large Joint Inj: R knee on 09/27/2020 3:24 PM Indications: diagnostic evaluation and pain Details: 22 G 1.5 in needle, superolateral approach  Arthrogram: No  Outcome: tolerated well, no immediate complications Procedure, treatment alternatives, risks and benefits explained, specific risks discussed. Consent was given by the patient. Immediately prior to procedure a time out was called to verify the correct patient, procedure, equipment, support staff and site/side marked as required. Patient was prepped and draped in the usual sterile fashion.    The patient is here today for #3 of a series of 3 hyaluronic acid injections in her right knee to treat the pain from osteoarthritis.  This is done over 3-week period of time.  She has had no adverse reaction to the first 2 injections and feels much better overall.  Her right knee shows no effusion.  She has good range of motion of that knee with obvious patellofemoral crepitation and pain but overall looks much better.  I did place injection #3 of Trivisc into her right knee which she tolerated well.  All questions and concerns were answered addressed.  She will continue work on quad strengthening exercises and weight loss.  Follow-up can be as needed.  We can always place a steroid injection in 3 to 4 months now in the right knee if needed.

## 2020-10-16 ENCOUNTER — Encounter: Payer: Self-pay | Admitting: Orthopaedic Surgery

## 2020-10-20 LAB — NOVEL CORONAVIRUS, NAA: SARS-CoV-2, NAA: POSITIVE

## 2020-10-23 ENCOUNTER — Other Ambulatory Visit: Payer: Self-pay | Admitting: Family Medicine

## 2020-10-24 ENCOUNTER — Encounter: Payer: BC Managed Care – PPO | Admitting: Family Medicine

## 2020-11-15 ENCOUNTER — Ambulatory Visit: Payer: BC Managed Care – PPO | Admitting: Family Medicine

## 2020-12-11 ENCOUNTER — Other Ambulatory Visit: Payer: Self-pay | Admitting: Family Medicine

## 2020-12-11 NOTE — Telephone Encounter (Signed)
30 day refill only and she needs a visit before any other refills. Thanks.

## 2020-12-11 NOTE — Telephone Encounter (Signed)
[facility] to [facility] pt effexor. Last ov was 08/01/20. Please advised [facility]

## 2020-12-14 ENCOUNTER — Encounter: Payer: Self-pay | Admitting: Family Medicine

## 2020-12-14 ENCOUNTER — Other Ambulatory Visit: Payer: Self-pay

## 2020-12-14 ENCOUNTER — Ambulatory Visit: Payer: BC Managed Care – PPO | Admitting: Family Medicine

## 2020-12-14 VITALS — BP 138/86 | HR 81 | Temp 97.8°F | Wt 274.6 lb

## 2020-12-14 DIAGNOSIS — E038 Other specified hypothyroidism: Secondary | ICD-10-CM

## 2020-12-14 DIAGNOSIS — F32A Depression, unspecified: Secondary | ICD-10-CM | POA: Diagnosis not present

## 2020-12-14 DIAGNOSIS — E063 Autoimmune thyroiditis: Secondary | ICD-10-CM

## 2020-12-14 DIAGNOSIS — D696 Thrombocytopenia, unspecified: Secondary | ICD-10-CM | POA: Diagnosis not present

## 2020-12-14 DIAGNOSIS — Z91030 Bee allergy status: Secondary | ICD-10-CM

## 2020-12-14 DIAGNOSIS — R748 Abnormal levels of other serum enzymes: Secondary | ICD-10-CM | POA: Diagnosis not present

## 2020-12-14 DIAGNOSIS — K7581 Nonalcoholic steatohepatitis (NASH): Secondary | ICD-10-CM

## 2020-12-14 DIAGNOSIS — E559 Vitamin D deficiency, unspecified: Secondary | ICD-10-CM

## 2020-12-14 DIAGNOSIS — H66001 Acute suppurative otitis media without spontaneous rupture of ear drum, right ear: Secondary | ICD-10-CM

## 2020-12-14 DIAGNOSIS — E7800 Pure hypercholesterolemia, unspecified: Secondary | ICD-10-CM | POA: Diagnosis not present

## 2020-12-14 MED ORDER — AMOXICILLIN-POT CLAVULANATE 500-125 MG PO TABS: 1.0000 | ORAL_TABLET | Freq: Three times a day (TID) | ORAL | 0 refills | Status: DC

## 2020-12-14 MED ORDER — VENLAFAXINE HCL ER 75 MG PO CP24: 75.0000 mg | ORAL_CAPSULE | Freq: Every day | ORAL | 5 refills | Status: DC

## 2020-12-14 NOTE — Progress Notes (Signed)
   Subjective:    Patient ID: Krista Kelley, female    DOB: 04-01-1956, 65 y.o.   MRN: [id number]  HPI Chief Complaint  Patient presents with  . Follow-up    Covid symptoms of the right ear and follow up on med   She is here to follow up on medications.   Depression - doing well on Effexor.   Hypothyroidism-needs PSA checked, this has been over a year.  Reports taking her medication appropriately.  States she has a sister who was recently diagnosed with leukemia.  She would like to check her blood counts today.  Complains of intermittent right ear pain and itching since having Covid the end of January.  She has been taking Tylenol Sinus..    Vitamin D deficiency and she is currently taking a supplement.  Request to check her vitamin D level.  History of Karlene Lineman and liver functions elevated in the past.  She did not follow-up with GI as I recommended.  She is also overdue for colonoscopy.  She requests a new referral to GI and states she will schedule a visit.   States she had Covid in January 2022.  Fully recovered except for her right ear issue   Review of Systems Pertinent positives and negatives in the history of present illness.     Objective:   Physical Exam BP 138/86   Pulse 81   Temp 97.8 F (36.6 C)   Wt 274 lb 9.6 oz (124.6 kg)   SpO2 97%   BMI 49.42 kg/m   Alert and in no distress.  Left TM and canal normal, right TM with yellow exudate and slightly bulging TM with a normal canal.  Neck is supple without adenopathy or thyromegaly. Cardiac exam shows a regular sinus rhythm without murmurs or gallops. Lungs are clear to auscultation.  Extremities without edema.  Skin is warm and dry.       Assessment & Plan:  Depression, unspecified depression type -She is doing well on Effexor.  I will refill this for 6 months.  Morbid obesity ([place]) -Encouraged healthy diet and increasing physical activity for weight loss  Vitamin D deficiency - Plan: VITAMIN D 25 Hydroxy  (Vit-D Deficiency, Fractures) -Check vitamin D level and adjust dose as appropriate  Elevated LDL cholesterol level - Plan: Lipid panel -Recommend low-fat, low-cholesterol diet and increasing physical activity.  Follow-up pending lipid panel results  Allergy to bee sting -EpiPen prescribed  Non-recurrent acute suppurative otitis media of right ear without spontaneous rupture of tympanic membrane - Plan: amoxicillin-clavulanate (AUGMENTIN) 500-125 MG tablet -Antibiotic prescribed.  She will follow-up if worsening or not back to baseline after completing the antibiotic.  Thrombocytopenia ([place]) - Plan: CBC with Differential/Platelet  NASH (nonalcoholic steatohepatitis) - Plan: Comprehensive metabolic panel, Ambulatory referral to Gastroenterology -Referral to GI for evaluation and monitoring  Elevated liver enzymes - Plan: Comprehensive metabolic panel, Ambulatory referral to Gastroenterology  Hypothyroidism due to Hashimoto's thyroiditis - Plan: TSH -Reports taking her medication appropriately.  Check thyroid function and follow-up

## 2020-12-14 NOTE — Patient Instructions (Addendum)
You will hear from Krista Kelley.   Take the antibiotic as prescribed. You may want to take a probiotic with it or eat greek yogurt with good bacteria in it while on the medication.

## 2020-12-15 LAB — CBC WITH DIFFERENTIAL/PLATELET
Basophils Absolute: 0.1 10*3/uL (ref 0.0–0.2)
Basos: 1 %
EOS (ABSOLUTE): 0.2 10*3/uL (ref 0.0–0.4)
Eos: 4 %
Hematocrit: 42.7 % (ref 34.0–46.6)
Hemoglobin: 14.3 g/dL (ref 11.1–15.9)
Immature Grans (Abs): 0 10*3/uL (ref 0.0–0.1)
Immature Granulocytes: 0 %
Lymphocytes Absolute: 1.1 10*3/uL (ref 0.7–3.1)
Lymphs: 21 %
MCH: 30.3 pg (ref 26.6–33.0)
MCHC: 33.5 g/dL (ref 31.5–35.7)
MCV: 91 fL (ref 79–97)
Monocytes Absolute: 0.4 10*3/uL (ref 0.1–0.9)
Monocytes: 8 %
Neutrophils Absolute: 3.5 10*3/uL (ref 1.4–7.0)
Neutrophils: 66 %
Platelets: 136 10*3/uL — ABNORMAL LOW (ref 150–450)
RBC: 4.72 x10E6/uL (ref 3.77–5.28)
RDW: 12.8 % (ref 11.7–15.4)
WBC: 5.2 10*3/uL (ref 3.4–10.8)

## 2020-12-15 LAB — LIPID PANEL
Chol/HDL Ratio: 4.9 ratio — ABNORMAL HIGH (ref 0.0–4.4)
Cholesterol, Total: 224 mg/dL — ABNORMAL HIGH (ref 100–199)
HDL: 46 mg/dL (ref >39)
LDL Chol Calc (NIH): 151 mg/dL — ABNORMAL HIGH (ref 0–99)
Triglycerides: 148 mg/dL (ref 0–149)
VLDL Cholesterol Cal: 27 mg/dL (ref 5–40)

## 2020-12-15 LAB — COMPREHENSIVE METABOLIC PANEL
ALT: 22 IU/L (ref 0–32)
AST: 27 IU/L (ref 0–40)
Albumin/Globulin Ratio: 1.3 (ref 1.2–2.2)
Albumin: 4 g/dL (ref 3.8–4.8)
Alkaline Phosphatase: 111 IU/L (ref 44–121)
BUN/Creatinine Ratio: 14 (ref 12–28)
BUN: 8 mg/dL (ref 8–27)
Bilirubin Total: 0.9 mg/dL (ref 0.0–1.2)
CO2: 25 mmol/L (ref 20–29)
Calcium: 9.7 mg/dL (ref 8.7–10.3)
Chloride: 105 mmol/L (ref 96–106)
Creatinine, Ser: 0.59 mg/dL (ref 0.57–1.00)
Globulin, Total: 3.1 g/dL (ref 1.5–4.5)
Glucose: 101 mg/dL — ABNORMAL HIGH (ref 65–99)
Potassium: 4.3 mmol/L (ref 3.5–5.2)
Sodium: 144 mmol/L (ref 134–144)
Total Protein: 7.1 g/dL (ref 6.0–8.5)
eGFR: 102 mL/min/{1.73_m2} (ref >59)

## 2020-12-15 LAB — VITAMIN D 25 HYDROXY (VIT D DEFICIENCY, FRACTURES): Vit D, 25-Hydroxy: 32.6 ng/mL (ref 30.0–100.0)

## 2020-12-15 LAB — TSH: TSH: 2.72 u[IU]/mL (ref 0.450–4.500)

## 2020-12-19 ENCOUNTER — Encounter: Payer: Self-pay | Admitting: Family Medicine

## 2020-12-28 ENCOUNTER — Encounter: Payer: Self-pay | Admitting: Physician Assistant

## 2021-01-12 ENCOUNTER — Encounter: Payer: Self-pay | Admitting: Internal Medicine

## 2021-01-15 ENCOUNTER — Encounter: Payer: Self-pay | Admitting: Physician Assistant

## 2021-01-15 ENCOUNTER — Ambulatory Visit (INDEPENDENT_AMBULATORY_CARE_PROVIDER_SITE_OTHER): Payer: BC Managed Care – PPO | Admitting: Physician Assistant

## 2021-01-15 ENCOUNTER — Other Ambulatory Visit: Payer: Self-pay

## 2021-01-15 VITALS — BP 126/64 | HR 72 | Ht 62.0 in | Wt 273.6 lb

## 2021-01-15 DIAGNOSIS — Z8379 Family history of other diseases of the digestive system: Secondary | ICD-10-CM | POA: Diagnosis not present

## 2021-01-15 DIAGNOSIS — Z1212 Encounter for screening for malignant neoplasm of rectum: Secondary | ICD-10-CM

## 2021-01-15 DIAGNOSIS — D696 Thrombocytopenia, unspecified: Secondary | ICD-10-CM | POA: Diagnosis not present

## 2021-01-15 DIAGNOSIS — Z1211 Encounter for screening for malignant neoplasm of colon: Secondary | ICD-10-CM | POA: Diagnosis not present

## 2021-01-15 NOTE — Patient Instructions (Signed)
If you are age 63 or older, your body mass index should be between 23-30. Your Body mass index is 50.04 kg/m. If this is out of the aforementioned range listed, please consider follow up with your Primary Care Provider.  If you are age 54 or younger, your body mass index should be between 19-25. Your Body mass index is 50.04 kg/m. If this is out of the aformentioned range listed, please consider follow up with your Primary Care Provider.   You will be contacted by [facility] in the next 2 days to arrange a [facility].  The number on your caller ID will be [phone], please answer when they call.  If you have not heard from them in 2 days please call [phone] to schedule.    We have sent a referral to Hematology they will call to schedule an appointment.  Thank you for choosing me and [facility].  Ellouise Newer, PA-C

## 2021-01-15 NOTE — Progress Notes (Signed)
Chief Complaint: Thrombocytopenia  HPI:    Krista Kelley is a 65 year old female with a past medical history as listed below, who was referred to me by Girtha Rm, NP-C for a complaint of thrombocytopenia.    12/14/2020 CBC with platelets at 136 (previously 138 a year ago and 174 2 years ago).  Otherwise normal, CMP normal.  TSH normal.    Today, the patient presents to clinic and tells me that she was sent here because she has low platelets and that can indicate cirrhosis.  Explains that about 3 years ago when she was living in [place] she had a minimally elevated LFTs and was sent to a gastroenterologist, apparently they did an ultrasound at that time which showed fatty liver.  They were not concerned given that her liver enzymes went back to normal.  Patient has no GI symptoms.    Patient denies previous colonoscopy or colon cancer screening.  Tells me that she does not want to have to go through the prep.    Denies fever, chills, weight loss, blood in her stool, change in bowel habits, heartburn, reflux, abdominal pain, family history of liver disease, history of hepatitis, IV drug use or blood transfusion.  Past Medical History:  Diagnosis Date  . Elevated liver enzymes   . Enlarged thyroid   . Hypothyroidism due to Hashimoto's thyroiditis   . Lichen planus   . NASH (nonalcoholic steatohepatitis)   . Vitamin D deficiency 08/03/2018    Past Surgical History:  Procedure Laterality Date  . CESAREAN SECTION     x3  . CHOLECYSTECTOMY  2000  . TOTAL ABDOMINAL HYSTERECTOMY W/ BILATERAL SALPINGOOPHORECTOMY  1998   fibroids    Current Outpatient Medications  Medication Sig Dispense Refill  . amoxicillin-clavulanate (AUGMENTIN) 500-125 MG tablet Take 1 tablet (500 mg total) by mouth 3 (three) times daily. 20 tablet 0  . Cetirizine HCl (ZYRTEC PO) Take by mouth.    . diphenhydrAMINE (BENADRYL) 50 MG capsule     . EPINEPHrine 0.3 mg/0.3 mL IJ SOAJ injection Inject into the muscle.    .  levothyroxine (SYNTHROID) 50 MCG tablet TAKE 1 TABLET DAILY 90 tablet 0  . milk thistle 175 MG tablet Take 175 mg by mouth daily.    . Probiotic Product (DIGESTIVE ADVANTAGE PO) Take 1 tablet by mouth.    . Probiotic Product (PROBIOTIC DAILY PO)     . venlafaxine XR (EFFEXOR-XR) 75 MG 24 hr capsule Take 1 capsule (75 mg total) by mouth daily. 30 capsule 5  . Vitamin D, Ergocalciferol, 2000 units CAPS Take 1 tablet by mouth every other day.    . Zinc 50 MG CAPS Take by mouth.     No current facility-administered medications for this visit.    Allergies as of 01/15/2021 - Review Complete 01/15/2021  Allergen Reaction Noted  . Other Anaphylaxis and Other (See Comments) 11/09/2017    Family History  Problem Relation Age of Onset  . Breast cancer Neg Hx     Social History   Socioeconomic History  . Marital status: Married    Spouse name: Not on file  . Number of children: Not on file  . Years of education: Not on file  . Highest education level: Not on file  Occupational History  . Not on file  Tobacco Use  . Smoking status: Never Smoker  . Smokeless tobacco: Never Used  Substance and Sexual Activity  . Alcohol use: No  . Drug use: No  .  Sexual activity: Yes    Partners: Male    Birth control/protection: None  Other Topics Concern  . Not on file  Social History Narrative  . Not on file   Social Determinants of Health   Financial Resource Strain: Not on file  Food Insecurity: Not on file  Transportation Needs: Not on file  Physical Activity: Not on file  Stress: Not on file  Social Connections: Not on file  Intimate Partner Violence: Not on file    Review of Systems:    Constitutional: No weight loss, fever or chills Skin: No rash  Cardiovascular: No chest pain Respiratory: No SOB  Gastrointestinal: See HPI and otherwise negative Genitourinary: No dysuria  Neurological: No headache, dizziness or syncope Musculoskeletal: No new muscle or joint  pain Hematologic: No bleeding  Psychiatric: No history of depression or anxiety   Physical Exam:  Vital signs: BP 126/64   Pulse 72   Ht 5' 2"  (1.575 m)   Wt 273 lb 9.6 oz (124.1 kg)   BMI 50.04 kg/m   Constitutional:   Pleasant obese Caucasian female appears to be in NAD, Well developed, Well nourished, alert and cooperative Head:  Normocephalic and atraumatic. Eyes:   PEERL, EOMI. No icterus. Conjunctiva pink. Ears:  Normal auditory acuity. Neck:  Supple Throat: Oral cavity and pharynx without inflammation, swelling or lesion.  Respiratory: Respirations even and unlabored. Lungs clear to auscultation bilaterally.   No wheezes, crackles, or rhonchi.  Cardiovascular: Normal S1, S2. No MRG. Regular rate and rhythm. No peripheral edema, cyanosis or pallor.  Gastrointestinal:  Soft, nondistended, nontender. No rebound or guarding. Normal bowel sounds. No appreciable masses or hepatomegaly. Rectal:  Not performed.  Msk:  Symmetrical without gross deformities. Without edema, no deformity or joint abnormality.  Neurologic:  Alert and  oriented x4;  grossly normal neurologically.  Skin:   Dry and intact without significant lesions or rashes. Psychiatric:  Demonstrates good judgement and reason without abnormal affect or behaviors.  RELEVANT LABS AND IMAGING: CBC    Component Value Date/Time   WBC 5.2 12/14/2020 1323   RBC 4.72 12/14/2020 1323   HGB 14.3 12/14/2020 1323   HCT 42.7 12/14/2020 1323   PLT 136 (L) 12/14/2020 1323   MCV 91 12/14/2020 1323   MCH 30.3 12/14/2020 1323   MCHC 33.5 12/14/2020 1323   RDW 12.8 12/14/2020 1323   LYMPHSABS 1.1 12/14/2020 1323   EOSABS 0.2 12/14/2020 1323   BASOSABS 0.1 12/14/2020 1323    CMP     Component Value Date/Time   NA 144 12/14/2020 1323   K 4.3 12/14/2020 1323   CL 105 12/14/2020 1323   CO2 25 12/14/2020 1323   GLUCOSE 101 (H) 12/14/2020 1323   BUN 8 12/14/2020 1323   CREATININE 0.59 12/14/2020 1323   CALCIUM 9.7 12/14/2020  1323   PROT 7.1 12/14/2020 1323   ALBUMIN 4.0 12/14/2020 1323   AST 27 12/14/2020 1323   ALT 22 12/14/2020 1323   ALKPHOS 111 12/14/2020 1323   BILITOT 0.9 12/14/2020 1323   GFRNONAA 97 09/22/2019 1227   GFRAA 112 09/22/2019 1227    Assessment: 1.  Thrombocytopenia: This has trended down over the past year per PCPs labs; consider hematological cause more likely than cirrhosis 2.  History of fatty liver: 3 years ago in [place], apparently seen on ultrasound, had elevated liver enzymes at the time, but not currently 3.  Screening for colorectal cancer: Patient has never had any screening  Plan: 1.  Would  recommend that we repeat a right upper quadrant ultrasound given her history of fatty liver in the past.  Did discuss though that I do not have high suspicion that she has cirrhosis given that all of her other labs are normal.  Did discuss a slow and steady weight loss for fatty liver history. 2.  Referred the patient to hematology for further evaluation of this thrombocytopenia. 3.  Did discuss various colon cancer screenings including Cologuard with the patient today.  She continues to decline for now, though I encouraged her to think about these earnestly.  We discussed details of both the Cologuard and colonoscopy.  Would recommend that she proceed with some sort of screening. 4.  Patient to follow in clinic per recommendations after ultrasound.  She was assigned to Dr. Bryan Lemma today.  Ellouise Newer, PA-C [facility] 01/15/2021, 11:17 AM  Cc: Henson, Vickie L, NP-C

## 2021-01-26 NOTE — Progress Notes (Signed)
Agree with the assessment and plan as outlined by Ellouise Newer, PA-C.  Aside from PLT 136, normal CBC and normal CMP without any elevated liver enzymes or hyperbilirubinemia.  Agree that hepatic synthetic function appears to be intact on available serologic studies.  Could also check INR to round out that work-up, but otherwise agree with referral to Hematology for evaluation of mild thrombocytopenia.  Otherwise, due for age-appropriate colon cancer screening, with multiple methods offered to patient by Anderson Malta, which were declined by patient.  Krista Gerling, DO, [facility]

## 2021-01-30 ENCOUNTER — Ambulatory Visit (HOSPITAL_COMMUNITY)
Admission: RE | Admit: 2021-01-30 | Discharge: 2021-01-30 | Disposition: A | Discharge status: Home / Self Care | Payer: BC Managed Care – PPO | Source: Ambulatory Visit | Attending: Physician Assistant | Admitting: Physician Assistant

## 2021-01-30 ENCOUNTER — Other Ambulatory Visit: Payer: Self-pay

## 2021-01-30 DIAGNOSIS — K760 Fatty (change of) liver, not elsewhere classified: Secondary | ICD-10-CM | POA: Diagnosis not present

## 2021-01-30 DIAGNOSIS — Z8379 Family history of other diseases of the digestive system: Secondary | ICD-10-CM | POA: Diagnosis not present

## 2021-02-02 ENCOUNTER — Encounter (HOSPITAL_COMMUNITY): Payer: Self-pay | Admitting: Hematology and Oncology

## 2021-02-02 ENCOUNTER — Inpatient Hospital Stay (HOSPITAL_COMMUNITY): Payer: BC Managed Care – PPO

## 2021-02-02 ENCOUNTER — Other Ambulatory Visit: Payer: Self-pay

## 2021-02-02 ENCOUNTER — Inpatient Hospital Stay (HOSPITAL_COMMUNITY): Payer: BC Managed Care – PPO | Attending: Hematology and Oncology | Admitting: Hematology and Oncology

## 2021-02-02 VITALS — BP 142/86 | HR 99 | Temp 97.6°F | Resp 18 | Wt 260.2 lb

## 2021-02-02 DIAGNOSIS — E678 Other specified hyperalimentation: Secondary | ICD-10-CM | POA: Diagnosis not present

## 2021-02-02 DIAGNOSIS — E063 Autoimmune thyroiditis: Secondary | ICD-10-CM | POA: Diagnosis not present

## 2021-02-02 DIAGNOSIS — Z79899 Other long term (current) drug therapy: Secondary | ICD-10-CM

## 2021-02-02 DIAGNOSIS — E669 Obesity, unspecified: Secondary | ICD-10-CM | POA: Diagnosis not present

## 2021-02-02 DIAGNOSIS — Z90722 Acquired absence of ovaries, bilateral: Secondary | ICD-10-CM

## 2021-02-02 DIAGNOSIS — D696 Thrombocytopenia, unspecified: Secondary | ICD-10-CM

## 2021-02-02 DIAGNOSIS — K7581 Nonalcoholic steatohepatitis (NASH): Secondary | ICD-10-CM | POA: Diagnosis not present

## 2021-02-02 DIAGNOSIS — Z90710 Acquired absence of both cervix and uterus: Secondary | ICD-10-CM | POA: Diagnosis not present

## 2021-02-02 DIAGNOSIS — Z9079 Acquired absence of other genital organ(s): Secondary | ICD-10-CM

## 2021-02-02 DIAGNOSIS — M2550 Pain in unspecified joint: Secondary | ICD-10-CM | POA: Insufficient documentation

## 2021-02-02 DIAGNOSIS — E559 Vitamin D deficiency, unspecified: Secondary | ICD-10-CM | POA: Diagnosis not present

## 2021-02-02 LAB — RETICULOCYTES
Immature Retic Fract: 8.2 % (ref 2.3–15.9)
RBC.: 4.79 MIL/uL (ref 3.87–5.11)
Retic Count, Absolute: 67.1 10*3/uL (ref 19.0–186.0)
Retic Ct Pct: 1.4 % (ref 0.4–3.1)

## 2021-02-02 LAB — FERRITIN: Ferritin: 80 ng/mL (ref 11–307)

## 2021-02-02 LAB — CBC WITH DIFFERENTIAL/PLATELET
Abs Immature Granulocytes: 0.02 10*3/uL (ref 0.00–0.07)
Basophils Absolute: 0.1 10*3/uL (ref 0.0–0.1)
Basophils Relative: 1 %
Eosinophils Absolute: 0.3 10*3/uL (ref 0.0–0.5)
Eosinophils Relative: 4 %
HCT: 43.4 % (ref 36.0–46.0)
Hemoglobin: 14.6 g/dL (ref 12.0–15.0)
Immature Granulocytes: 0 %
Lymphocytes Relative: 21 %
Lymphs Abs: 1.3 10*3/uL (ref 0.7–4.0)
MCH: 30 pg (ref 26.0–34.0)
MCHC: 33.6 g/dL (ref 30.0–36.0)
MCV: 89.3 fL (ref 80.0–100.0)
Monocytes Absolute: 0.5 10*3/uL (ref 0.1–1.0)
Monocytes Relative: 9 %
Neutro Abs: 3.9 10*3/uL (ref 1.7–7.7)
Neutrophils Relative %: 65 %
Platelets: 123 10*3/uL — ABNORMAL LOW (ref 150–400)
RBC: 4.86 MIL/uL (ref 3.87–5.11)
RDW: 12.7 % (ref 11.5–15.5)
WBC: 6 10*3/uL (ref 4.0–10.5)
nRBC: 0 % (ref 0.0–0.2)

## 2021-02-02 LAB — HEPATITIS PANEL, ACUTE
HCV Ab: NONREACTIVE
Hep A IgM: NONREACTIVE
Hep B C IgM: NONREACTIVE
Hepatitis B Surface Ag: NONREACTIVE

## 2021-02-02 LAB — IRON AND TIBC
Iron: 132 ug/dL (ref 28–170)
Saturation Ratios: 34 % — ABNORMAL HIGH (ref 10.4–31.8)
TIBC: 392 ug/dL (ref 250–450)
UIBC: 260 ug/dL

## 2021-02-02 LAB — APTT: aPTT: 29 seconds (ref 24–36)

## 2021-02-02 LAB — PROTIME-INR
INR: 1 (ref 0.8–1.2)
Prothrombin Time: 13.3 seconds (ref 11.4–15.2)

## 2021-02-02 LAB — LACTATE DEHYDROGENASE: LDH: 195 U/L — ABNORMAL HIGH (ref 98–192)

## 2021-02-02 LAB — VITAMIN B12: Vitamin B-12: 1065 pg/mL — ABNORMAL HIGH (ref 180–914)

## 2021-02-02 NOTE — Assessment & Plan Note (Addendum)
This is a very pleasant 65 year old female patient with past medical history significant for Krista Kelley referred to hematology for evaluation of thrombocytopenia.  Krista Kelley absolutely denies any new health complaints except for some joint pains which have been chronic.  She was told that she had nausea about 5 to 6 years ago when she started taking milk thistle around 2 to 3 years ago along with some zinc supplementation to help her immunity from the COVID-19 virus.  She denies any B symptoms, bleeding complaints, known autoimmune diseases, nutritional deficiencies, hypothyroidism. Physical examination today quite unremarkable except for abdominal obesity. I reviewed her labs for the past 3 years.  Her thrombocytopenia was first noticed around December 2020.  Since she got her vaccine after this, I do not believe this is related to the COVID-19 vaccine nor the virus. There has been some reports of thrombocytopenia from milk thistle cell ingestion according to literature review.  I have asked her to stop the milk thistle for about 3 to 4 weeks, return to clinic to review lab at that time and to discuss any additional recommendations.  We have also discussed other common causes of thrombocytopenia including but not limited to medications, nutritional deficiencies, autoimmune diseases, bone marrow disorders, liver problems, ITP etc.  We will proceed with some laboratory investigation today. I recommended age-appropriate cancer screening, patient overdue for colonoscopy and mammogram. She does not have any family history of breast cancer, pancreatic cancer, ovarian cancer, metastatic prostate cancer or melanoma hence no indication for genetic testing.

## 2021-02-02 NOTE — Progress Notes (Signed)
[facility] CONSULT NOTE  Patient Care Team: Girtha Rm, NP-C as PCP - General (Family Medicine)  CHIEF COMPLAINTS/PURPOSE OF CONSULTATION:  Thrombocytopenia.  ASSESSMENT & PLAN:   Thrombocytopenia ([place]) This is a very pleasant 65 year old female patient with past medical history significant for Krista Kelley referred to hematology for evaluation of thrombocytopenia.  Krista Kelley absolutely denies any new health complaints except for some joint pains which have been chronic.  She was told that she had nausea about 5 to 6 years ago when she started taking milk thistle around 2 to 3 years ago along with some zinc supplementation to help her immunity from the COVID-19 virus.  She denies any B symptoms, bleeding complaints, known autoimmune diseases, nutritional deficiencies, hypothyroidism. Physical examination today quite unremarkable except for abdominal obesity. I reviewed her labs for the past 3 years.  Her thrombocytopenia was first noticed around December 2020.  Since she got her vaccine after this, I do not believe this is related to the COVID-19 vaccine nor the virus. There has been some reports of thrombocytopenia from milk thistle cell ingestion according to literature review.  I have asked her to stop the milk thistle for about 3 to 4 weeks, return to clinic to review lab at that time and to discuss any additional recommendations.  We have also discussed other common causes of thrombocytopenia including but not limited to nutritional deficiencies, autoimmune diseases, bone marrow disorders, liver problems, ITP etc.  We will proceed with some laboratory investigation today. I recommended age-appropriate cancer screening, patient overdue for colonoscopy and mammogram. She does not have any family history of breast cancer, pancreatic cancer, ovarian cancer, metastatic prostate cancer or melanoma hence no indication for genetic testing.  Orders Placed This Encounter  Procedures  .  CBC with Differential/Platelet    Standing Status:   Standing    Number of Occurrences:   22    Standing Expiration Date:   02/02/2022  . Iron and TIBC    Standing Status:   Future    Standing Expiration Date:   02/02/2022  . Ferritin    Standing Status:   Future    Standing Expiration Date:   02/02/2022  . Vitamin B12    Standing Status:   Future    Standing Expiration Date:   02/02/2022  . Folate RBC    Standing Status:   Future    Standing Expiration Date:   02/02/2022  . Lactate dehydrogenase    Standing Status:   Future    Standing Expiration Date:   02/02/2022  . Reticulocytes    Standing Status:   Future    Standing Expiration Date:   02/02/2022  . APTT    Standing Status:   Future    Standing Expiration Date:   02/02/2022  . Hepatitis panel, acute    Standing Status:   Future    Standing Expiration Date:   02/02/2022  . Protime-INR    Standing Status:   Future    Standing Expiration Date:   02/02/2022  . Pathologist smear review    Standing Status:   Future    Standing Expiration Date:   02/02/2022  . TSH    Standing Status:   Standing    Number of Occurrences:   22    Standing Expiration Date:   02/02/2022     HISTORY OF PRESENTING ILLNESS:  Krista Kelley 65 y.o. female is here because of thrombocytopenia.  This is a very pleasant 65 yr old  female patient with NASH referred to hematology for evaluation of thrombocytopenia. Krista Kelley is an excellent historian, no complaints today except for some knee issues. She denies any B symptoms, bleeding complaints, known nutritional deficiency, autoimmune diseases. No change in breathing, bowel or urinary habits She has been taking milk thistle for about 2 yrs and zinc supplementation from around the same time. She is due for colonoscopy. Rest of the 10 point ROS reviewed and negative.  REVIEW OF SYSTEMS:   Constitutional: Denies fevers, chills or abnormal night sweats Eyes: Denies blurriness of vision, double vision or watery  eyes Ears, nose, mouth, throat, and face: Denies mucositis or sore throat Respiratory: Denies cough, dyspnea or wheezes Cardiovascular: Denies palpitation, chest discomfort or lower extremity swelling Gastrointestinal:  Denies nausea, heartburn or change in bowel habits Skin: Denies abnormal skin rashes Lymphatics: Denies new lymphadenopathy or easy bruising Neurological:Denies numbness, tingling or new weaknesses Behavioral/Psych: Mood is stable, no new changes  All other systems were reviewed with the patient and are negative.  MEDICAL HISTORY:  Past Medical History:  Diagnosis Date  . Elevated liver enzymes   . Enlarged thyroid   . Hypothyroidism due to Hashimoto's thyroiditis   . Lichen planus   . NASH (nonalcoholic steatohepatitis)   . Vitamin D deficiency 08/03/2018    SURGICAL HISTORY: Past Surgical History:  Procedure Laterality Date  . CESAREAN SECTION     x3  . CHOLECYSTECTOMY  2000  . TOTAL ABDOMINAL HYSTERECTOMY W/ BILATERAL SALPINGOOPHORECTOMY  1998   fibroids    SOCIAL HISTORY: Social History   Socioeconomic History  . Marital status: Married    Spouse name: Not on file  . Number of children: Not on file  . Years of education: Not on file  . Highest education level: Not on file  Occupational History  . Not on file  Tobacco Use  . Smoking status: Never Smoker  . Smokeless tobacco: Never Used  Substance and Sexual Activity  . Alcohol use: No  . Drug use: No  . Sexual activity: Yes    Partners: Male    Birth control/protection: None  Other Topics Concern  . Not on file  Social History Narrative  . Not on file   Social Determinants of Health   Financial Resource Strain: Not on file  Food Insecurity: Not on file  Transportation Needs: Not on file  Physical Activity: Not on file  Stress: Not on file  Social Connections: Not on file  Intimate Partner Violence: Not on file    FAMILY HISTORY: Family History  Problem Relation Age of Onset  .  Breast cancer Neg Hx     ALLERGIES:  is allergic to other.  MEDICATIONS:  Current Outpatient Medications  Medication Sig Dispense Refill  . Cetirizine HCl (ZYRTEC PO) Take by mouth.    . diphenhydrAMINE (BENADRYL) 50 MG capsule     . EPINEPHrine 0.3 mg/0.3 mL IJ SOAJ injection Inject into the muscle.    . levothyroxine (SYNTHROID) 50 MCG tablet TAKE 1 TABLET DAILY 90 tablet 0  . milk thistle 175 MG tablet Take 1,000 mg by mouth daily.    . Probiotic Product (DIGESTIVE ADVANTAGE PO) Take 1 tablet by mouth.    . venlafaxine XR (EFFEXOR-XR) 75 MG 24 hr capsule Take 1 capsule (75 mg total) by mouth daily. 30 capsule 5  . Vitamin D, Ergocalciferol, 2000 units CAPS Take 2 tablets by mouth daily.    . Zinc 100 MG TABS Take 100 mg by mouth daily.    Marland Kitchen  Probiotic Product (PROBIOTIC DAILY PO)      No current facility-administered medications for this visit.     PHYSICAL EXAMINATION:  ECOG PERFORMANCE STATUS: 0 - Asymptomatic  Vitals:   02/02/21 1225  BP: (!) 142/86  Pulse: 99  Resp: 18  Temp: 97.6 F (36.4 C)  SpO2: 94%   Filed Weights   02/02/21 1225  Weight: 260 lb 3 oz (118 kg)    GENERAL:alert, no distress and comfortable SKIN: skin color, texture, turgor are normal, no rashes or significant lesions EYES: normal, conjunctiva are pink and non-injected, sclera clear OROPHARYNX:no exudate, no erythema and lips, buccal mucosa, and tongue normal  NECK: supple, thyroid normal size, non-tender, without nodularity LYMPH:  no palpable lymphadenopathy in the cervical, axillary or inguinal LUNGS: clear to auscultation and percussion with normal breathing effort HEART: regular rate & rhythm and no murmurs and no lower extremity edema ABDOMEN:abdomen soft, non-tender , organomegaly hard to appreciate given abdominal obesity. Musculoskeletal:no cyanosis of digits and no clubbing  PSYCH: alert & oriented x 3 with fluent speech NEURO: no focal motor/sensory deficits  LABORATORY DATA:   I have reviewed the data as listed Lab Results  Component Value Date   WBC 5.2 12/14/2020   HGB 14.3 12/14/2020   HCT 42.7 12/14/2020   MCV 91 12/14/2020   PLT 136 (L) 12/14/2020     Chemistry      Component Value Date/Time   NA 144 12/14/2020 1323   K 4.3 12/14/2020 1323   CL 105 12/14/2020 1323   CO2 25 12/14/2020 1323   BUN 8 12/14/2020 1323   CREATININE 0.59 12/14/2020 1323      Component Value Date/Time   CALCIUM 9.7 12/14/2020 1323   ALKPHOS 111 12/14/2020 1323   AST 27 12/14/2020 1323   ALT 22 12/14/2020 1323   BILITOT 0.9 12/14/2020 1323     Reviewed labs for the past 3 yrs First low platelet count was detected in Dec 2020. Prior to that her counts were normal.  RADIOGRAPHIC STUDIES: I have personally reviewed the radiological images as listed and agreed with the findings in the report. US Abdomen Limited RUQ (LIVER/GB)  Result Date: 01/30/2021 CLINICAL DATA:  History of fatty liver EXAM: ULTRASOUND ABDOMEN LIMITED RIGHT UPPER QUADRANT COMPARISON:  None. FINDINGS: Gallbladder: Surgically absent Common bile duct: Diameter: 5.6 mm Liver: No solid focal lesion identified. 2.2 cm cyst in the left lobe of liver. Coarsened hepatic echotexture with increased parenchymal echogenicity. Portal vein is patent on color Doppler imaging with normal direction of blood flow towards the liver. Other: None. IMPRESSION: 1. Findings consistent with fatty infiltration of the liver. No solid hepatic lesion visualized. Electronically Signed   By: Dahlia Bailiff MD   On: 01/30/2021 23:28    All questions were answered. The patient knows to call the clinic with any problems, questions or concerns. I spent 35 minutes in the care of this patient including H and P, review of records, counseling and coordination of care.     Benay Pike, MD 02/02/2021 1:28 PM

## 2021-02-03 LAB — FOLATE RBC
Folate, Hemolysate: 426 ng/mL
Folate, RBC: 959 ng/mL (ref >498)
Hematocrit: 44.4 % (ref 34.0–46.6)

## 2021-02-05 LAB — PATHOLOGIST SMEAR REVIEW

## 2021-02-12 ENCOUNTER — Telehealth: Payer: BC Managed Care – PPO | Admitting: Family Medicine

## 2021-02-12 ENCOUNTER — Encounter: Payer: Self-pay | Admitting: Family Medicine

## 2021-02-12 VITALS — Temp 97.7°F | Wt 260.0 lb

## 2021-02-12 DIAGNOSIS — H66001 Acute suppurative otitis media without spontaneous rupture of ear drum, right ear: Secondary | ICD-10-CM | POA: Diagnosis not present

## 2021-02-12 DIAGNOSIS — H9201 Otalgia, right ear: Secondary | ICD-10-CM

## 2021-02-12 DIAGNOSIS — H60501 Unspecified acute noninfective otitis externa, right ear: Secondary | ICD-10-CM

## 2021-02-12 DIAGNOSIS — J029 Acute pharyngitis, unspecified: Secondary | ICD-10-CM

## 2021-02-12 DIAGNOSIS — R6883 Chills (without fever): Secondary | ICD-10-CM

## 2021-02-12 MED ORDER — NEOMYCIN-POLYMYXIN-HC 3.5-10000-1 OT SOLN: 4.0000 [drp] | Freq: Four times a day (QID) | OTIC | 0 refills | Status: DC

## 2021-02-12 MED ORDER — AZITHROMYCIN 250 MG PO TABS: ORAL_TABLET | ORAL | 0 refills | Status: AC

## 2021-02-12 NOTE — Progress Notes (Signed)
   Subjective:  Documentation for virtual audio and video telecommunications through [place] encounter:  The patient was located at home. 2 patient identifiers used.  The provider was located in the office. The patient did consent to this visit and is aware of possible charges through their insurance for this visit.  The other persons participating in this telemedicine service were none. Time spent on call was 16 minutes and in review of previous records 20 minutes total.  This virtual service is not related to other E/M service within previous 7 days.   Patient ID: Krista Kelley, female    DOB: 07-Jul-1956, 65 y.o.   MRN: [id number]  HPI Chief Complaint  Patient presents with  . ear pain    Right ear pain x 1 week,  with sore throat, runny nose- yeasterday, chills, has a knot under right ear   Complains of a one week history of right ear pain and tenderness as well as rhinorrhea, chills, sore throat, sinus pressure that is worse.  Taking Tylenol and only.   I saw her approximately 6 weeks ago with similar symptoms and she was 60% improved.   She had Covid in January 2022. Has not done a home Covid test   Denies fever, chills, dizziness, chest pain, palpitations, shortness of breath, abdominal pain, N/V/D.   Reviewed allergies, medications, past medical, surgical, family, and social history.   Review of Systems Pertinent positives and negatives in the history of present illness.     Objective:   Physical Exam Temp 97.7 F (36.5 C)   Wt 260 lb (117.9 kg)   BMI 47.55 kg/m   And oriented and in no acute distress.  Respirations unlabored.  Tenderness to palpation of the right external ear when she palpates it.  She also notes enlarged lymph node with tenderness in the anterior cervical region and preoccipital.        Assessment & Plan:  Acute otitis externa of right ear, unspecified type - Plan: neomycin-polymyxin-hydrocortisone (CORTISPORIN) OTIC  solution  Non-recurrent acute suppurative otitis media of right ear without spontaneous rupture of tympanic membrane - Plan: azithromycin (ZITHROMAX) 250 MG tablet  Right ear pain  Chills  Acute pharyngitis, unspecified etiology  She will do a home Covid test and let me know if it is positive.  She declines taking PCN or Augmentin. I will prescribe azithromycin and Cortisporin otic.  Discussed symptomatic treatment and she will call if worsening or not back to baseline after completing the antibiotic.

## 2021-02-16 ENCOUNTER — Other Ambulatory Visit: Payer: Self-pay | Admitting: Family Medicine

## 2021-02-16 DIAGNOSIS — Z1231 Encounter for screening mammogram for malignant neoplasm of breast: Secondary | ICD-10-CM

## 2021-02-25 ENCOUNTER — Encounter: Payer: Self-pay | Admitting: Family Medicine

## 2021-02-26 MED ORDER — LEVOTHYROXINE SODIUM 50 MCG PO TABS: 50.0000 ug | ORAL_TABLET | Freq: Every day | ORAL | 1 refills | Status: DC

## 2021-03-02 ENCOUNTER — Other Ambulatory Visit: Payer: Self-pay

## 2021-03-02 ENCOUNTER — Inpatient Hospital Stay (HOSPITAL_COMMUNITY): Payer: BC Managed Care – PPO | Attending: Hematology and Oncology | Admitting: Hematology and Oncology

## 2021-03-02 ENCOUNTER — Encounter (HOSPITAL_COMMUNITY): Payer: Self-pay | Admitting: Hematology and Oncology

## 2021-03-02 ENCOUNTER — Inpatient Hospital Stay (HOSPITAL_COMMUNITY): Payer: BC Managed Care – PPO

## 2021-03-02 VITALS — BP 156/68 | HR 86 | Temp 96.8°F | Resp 20 | Wt 272.6 lb

## 2021-03-02 DIAGNOSIS — D696 Thrombocytopenia, unspecified: Secondary | ICD-10-CM | POA: Insufficient documentation

## 2021-03-02 DIAGNOSIS — E063 Autoimmune thyroiditis: Secondary | ICD-10-CM | POA: Insufficient documentation

## 2021-03-02 DIAGNOSIS — Z79899 Other long term (current) drug therapy: Secondary | ICD-10-CM | POA: Diagnosis not present

## 2021-03-02 LAB — CBC WITH DIFFERENTIAL/PLATELET
Abs Immature Granulocytes: 0.02 10*3/uL (ref 0.00–0.07)
Basophils Absolute: 0.1 10*3/uL (ref 0.0–0.1)
Basophils Relative: 1 %
Eosinophils Absolute: 0.2 10*3/uL (ref 0.0–0.5)
Eosinophils Relative: 5 %
HCT: 43.3 % (ref 36.0–46.0)
Hemoglobin: 14.4 g/dL (ref 12.0–15.0)
Immature Granulocytes: 0 %
Lymphocytes Relative: 25 %
Lymphs Abs: 1.1 10*3/uL (ref 0.7–4.0)
MCH: 30.1 pg (ref 26.0–34.0)
MCHC: 33.3 g/dL (ref 30.0–36.0)
MCV: 90.6 fL (ref 80.0–100.0)
Monocytes Absolute: 0.4 10*3/uL (ref 0.1–1.0)
Monocytes Relative: 9 %
Neutro Abs: 2.7 10*3/uL (ref 1.7–7.7)
Neutrophils Relative %: 60 %
Platelets: 115 10*3/uL — ABNORMAL LOW (ref 150–400)
RBC: 4.78 MIL/uL (ref 3.87–5.11)
RDW: 12.8 % (ref 11.5–15.5)
WBC: 4.5 10*3/uL (ref 4.0–10.5)
nRBC: 0 % (ref 0.0–0.2)

## 2021-03-02 LAB — TSH: TSH: 1.819 u[IU]/mL (ref 0.350–4.500)

## 2021-03-02 NOTE — Progress Notes (Signed)
[facility] CONSULT NOTE  Patient Care Team: Girtha Rm, NP-C as PCP - General (Family Medicine)  CHIEF COMPLAINTS/PURPOSE OF CONSULTATION:  Thrombocytopenia.  ASSESSMENT & PLAN:   This is a very pleasant 65 year old female patient with past medical history significant for nonalcoholic steatohepatitis referred to hematology for evaluation of mild thrombocytopenia. Since her last visit, she denies any new complaints except for episode of sinusitis. We have reviewed her labs which suggest mild thrombocytopenia but no other major evidence of nutritional deficiency, hemolysis, hepatitis or hypothyroidism. We have discussed about continuing surveillance since thrombocytopenia is mild and is most likely secondary to mild ITP.  She was however encouraged to contact us with any new questions or concerns and she expressed understanding. CBC repeated today, she will return to clinic in 3 months for follow-up with repeat labs.  HISTORY OF PRESENTING ILLNESS:  Krista Kelley 65 y.o. female is here because of thrombocytopenia.  This is a very pleasant 65 yr old female patient with NASH referred to hematology for evaluation of thrombocytopenia patient.  Since her last visit, she had a sinus infection for which she took Z-Pak for for 5 days. Otherwise, she has been feeling remarkably well.  No B symptoms.  No bleeding complaints.  No change in breathing.  No change in bowel or urinary habits.  No new medications.  Rest of the pertinent 10 point ROS reviewed and negative.  MEDICAL HISTORY:  Past Medical History:  Diagnosis Date  . Elevated liver enzymes   . Enlarged thyroid   . Hypothyroidism due to Hashimoto's thyroiditis   . Lichen planus   . NASH (nonalcoholic steatohepatitis)   . Vitamin D deficiency 08/03/2018    SURGICAL HISTORY: Past Surgical History:  Procedure Laterality Date  . CESAREAN SECTION     x3  . CHOLECYSTECTOMY  2000  . TOTAL ABDOMINAL HYSTERECTOMY W/  BILATERAL SALPINGOOPHORECTOMY  1998   fibroids    SOCIAL HISTORY: Social History   Socioeconomic History  . Marital status: Married    Spouse name: Not on file  . Number of children: Not on file  . Years of education: Not on file  . Highest education level: Not on file  Occupational History  . Not on file  Tobacco Use  . Smoking status: Never Smoker  . Smokeless tobacco: Never Used  Substance and Sexual Activity  . Alcohol use: No  . Drug use: No  . Sexual activity: Yes    Partners: Male    Birth control/protection: None  Other Topics Concern  . Not on file  Social History Narrative  . Not on file   Social Determinants of Health   Financial Resource Strain: Not on file  Food Insecurity: Not on file  Transportation Needs: Not on file  Physical Activity: Not on file  Stress: Not on file  Social Connections: Not on file  Intimate Partner Violence: Not on file    FAMILY HISTORY: Family History  Problem Relation Age of Onset  . Breast cancer Neg Hx     ALLERGIES:  is allergic to other.  MEDICATIONS:  Current Outpatient Medications  Medication Sig Dispense Refill  . Cetirizine HCl (ZYRTEC PO) Take by mouth.    . diphenhydrAMINE (BENADRYL) 50 MG capsule     . EPINEPHrine 0.3 mg/0.3 mL IJ SOAJ injection Inject into the muscle.    . levothyroxine (SYNTHROID) 50 MCG tablet Take 1 tablet (50 mcg total) by mouth daily. 90 tablet 1  . neomycin-polymyxin-hydrocortisone (CORTISPORIN) OTIC solution  Place 4 drops into the right ear 4 (four) times daily. 10 mL 0  . venlafaxine XR (EFFEXOR-XR) 75 MG 24 hr capsule Take 1 capsule (75 mg total) by mouth daily. 30 capsule 5   No current facility-administered medications for this visit.     PHYSICAL EXAMINATION:  ECOG PERFORMANCE STATUS: 0 - Asymptomatic  Vitals:   03/02/21 1132  BP: (!) 156/68  Pulse: 86  Resp: 20  Temp: (!) 96.8 F (36 C)  SpO2: 99%   Filed Weights   03/02/21 1132  Weight: 272 lb 9.6 oz (123.7  kg)    Physical exam deferred in lieu of counseling.  LABORATORY DATA:  I have reviewed the data as listed Lab Results  Component Value Date   WBC 6.0 02/02/2021   HGB 14.6 02/02/2021   HCT 43.4 02/02/2021   HCT 44.4 02/02/2021   MCV 89.3 02/02/2021   PLT 123 (L) 02/02/2021     Chemistry      Component Value Date/Time   NA 144 12/14/2020 1323   K 4.3 12/14/2020 1323   CL 105 12/14/2020 1323   CO2 25 12/14/2020 1323   BUN 8 12/14/2020 1323   CREATININE 0.59 12/14/2020 1323      Component Value Date/Time   CALCIUM 9.7 12/14/2020 1323   ALKPHOS 111 12/14/2020 1323   AST 27 12/14/2020 1323   ALT 22 12/14/2020 1323   BILITOT 0.9 12/14/2020 1323     CBC showed mild thrombocytopenia, iron panel, S97 and folic acid normal.  W26 slightly at the upper limit of normal.  LDH slightly at the upper limit of normal.  No hemolysis.  No coagulopathy.  Hepatitis panel is unremarkable.  Smear review showed mild thrombocytopenia.  RADIOGRAPHIC STUDIES: I have personally reviewed the radiological images as listed and agreed with the findings in the report. No results found.  All questions were answered. The patient knows to call the clinic with any problems, questions or concerns. I spent 15 minutes in the care of this patient including H and P, review of records, counseling and coordination of care. We have once again reviewed common causes of thrombocytopenia, discussed about considering surveillance with follow-up in 3 months but she was encouraged to contact us with any new questions or concerns.  We also plan to repeat CBC today because she discontinued her milk thistle.    Benay Pike, MD 03/02/2021 12:09 PM

## 2021-03-08 ENCOUNTER — Encounter (HOSPITAL_COMMUNITY): Payer: Self-pay | Admitting: Hematology and Oncology

## 2021-03-12 ENCOUNTER — Encounter: Payer: Self-pay | Admitting: Family Medicine

## 2021-03-12 ENCOUNTER — Telehealth: Payer: BC Managed Care – PPO | Admitting: Family Medicine

## 2021-03-12 VITALS — Temp 97.7°F | Ht 62.0 in

## 2021-03-12 DIAGNOSIS — N3000 Acute cystitis without hematuria: Secondary | ICD-10-CM | POA: Diagnosis not present

## 2021-03-12 MED ORDER — NITROFURANTOIN MONOHYD MACRO 100 MG PO CAPS: 100.0000 mg | ORAL_CAPSULE | Freq: Two times a day (BID) | ORAL | 0 refills | Status: DC

## 2021-03-12 NOTE — Patient Instructions (Addendum)
Drink plenty of water, stay well hydrated. Take the antibiotic twice daily.  We discussed that if your symptoms completely resolve within 48 hours, you can stop the antibiotic after 5 days (especially if you have any side effects from it).  If it takes a little longer for symptoms to resolve, then complete the full 7 day course.  Contact us if you don't have improvement in your symptoms after 48-72 hours on antibiotics, so that we can change to a different antibiotic. Seek additional evaluation if you develop fever, flank pain, vomiting (and unable to keep the antibiotics down), or other new symptoms or concerns.

## 2021-03-12 NOTE — Progress Notes (Signed)
Start time: 11:46 End time:  12:02  Virtual Visit via Video Note  I connected with Krista Kelley on 03/12/21 by a video enabled telemedicine application and verified that I am speaking with the correct person using two identifiers.  Pt had sound issues--had to use telephone for sound, which cut out her video, but she could see physician the whole time.   Location: Patient: home Provider: office   I discussed the limitations of evaluation and management by telemedicine and the availability of in person appointments. The patient expressed understanding and agreed to proceed.  History of Present Illness:  Chief Complaint  Patient presents with  . Urinary Frequency    VIRTUAL urinary frequency, urgency and burning-when she does go she doesn't go very much. Taking OTC AZO to make it through the weekend. Symptoms started Friday night and worsened Saturday morning.    Visit is done virtually as she is unable to come to the office, caring for her grandchildren.  She presents with complaints of urinary urgency, frequency, and only voiding small amounts.  Started 2 nights ago, got worse 2 days ago. No fever or chills, no flank or back pain.  Denies hematuria. Urine is orange from the Azo.  She took Azo through Sunday, stopped after 2 days. There is an odor to the urine, different from normal.   She has h/o several UTI's, none recent.  Chart reviewed, last urine culture 02/2019 was negative.  PMH, PSH, SH reviewed   Outpatient Encounter Medications as of 03/12/2021  Medication Sig Note  . Cetirizine HCl (ZYRTEC PO) Take by mouth.   . levothyroxine (SYNTHROID) 50 MCG tablet Take 1 tablet (50 mcg total) by mouth daily.   . Milk Thistle 1000 MG CAPS Take 1 capsule by mouth daily.   . Probiotic Product (PROBIOTIC DAILY PO) Take 1 capsule by mouth daily.   Marland Kitchen venlafaxine XR (EFFEXOR-XR) 75 MG 24 hr capsule Take 1 capsule (75 mg total) by mouth daily.   Marland Kitchen VITAMIN D PO Take 2,000 Int'l Units by  mouth daily.   . [DISCONTINUED] MILK THISTLE PO Take 1 capsule by mouth daily.   . diphenhydrAMINE (BENADRYL) 50 MG capsule  (Patient not taking: Reported on 03/12/2021)   . EPINEPHrine 0.3 mg/0.3 mL IJ SOAJ injection Inject into the muscle. (Patient not taking: Reported on 03/12/2021)   . Phenazopyridine HCl (AZO URINARY PAIN PO) Take 2 tablets by mouth as needed. (Patient not taking: Reported on 03/12/2021) 03/12/2021: Finished-took for the 2 days as directed  . [DISCONTINUED] neomycin-polymyxin-hydrocortisone (CORTISPORIN) OTIC solution Place 4 drops into the right ear 4 (four) times daily.    No facility-administered encounter medications on file as of 03/12/2021.   Allergies  Allergen Reactions  . Other Anaphylaxis and Other (See Comments)    Bee and wasp sting   ROS: no fever, chills, n/v/d, flank pain, abdominal pain, vaginal discharge, bleeding, URI symptoms or other complaints. See HPi.  Observations/Objective:  Temp 97.7 F (36.5 C) (Temporal)   Ht 5' 2"  (1.575 m)   BMI 49.86 kg/m   Well-appearing, pleasant female, in no distress She is alert, oriented, speaking easily. Exam is limited due to virtual nature of the visit.   Assessment and Plan:  Acute cystitis without hematuria - Plan: nitrofurantoin, macrocrystal-monohydrate, (MACROBID) 100 MG capsule  Discussed limitations with virtual visit, not having urine specimen for culture. Will treat presumptively--prescribed 7d course, but if having side effects and if symptoms completely resolved within 2 days, she can stop after 5  day course. If symptoms not improving, contact us for change in ABX, and then to f/u if still not improving. To f/u if any fever, vomiting, flank pain or other worsening or new symptoms.  If not better after 72 hours, contact the office for a change in antibiotic   Follow Up Instructions:    I discussed the assessment and treatment plan with the patient. The patient was provided an opportunity to ask  questions and all were answered. The patient agreed with the plan and demonstrated an understanding of the instructions.   The patient was advised to call back or seek an in-person evaluation if the symptoms worsen or if the condition fails to improve as anticipated.  I spent 20 minutes dedicated to the care of this patient, including pre-visit review of records, face to face time, post-visit ordering of testing and documentation.    Vikki Ports, MD

## 2021-03-27 ENCOUNTER — Other Ambulatory Visit (HOSPITAL_COMMUNITY): Payer: BC Managed Care – PPO

## 2021-03-30 ENCOUNTER — Other Ambulatory Visit (HOSPITAL_COMMUNITY): Payer: Self-pay

## 2021-04-02 ENCOUNTER — Other Ambulatory Visit: Payer: Self-pay

## 2021-04-02 ENCOUNTER — Inpatient Hospital Stay (HOSPITAL_COMMUNITY): Payer: BC Managed Care – PPO | Attending: Hematology

## 2021-04-02 DIAGNOSIS — D696 Thrombocytopenia, unspecified: Secondary | ICD-10-CM | POA: Diagnosis not present

## 2021-04-02 LAB — CBC WITH DIFFERENTIAL/PLATELET
Abs Immature Granulocytes: 0.02 10*3/uL (ref 0.00–0.07)
Basophils Absolute: 0.1 10*3/uL (ref 0.0–0.1)
Basophils Relative: 1 %
Eosinophils Absolute: 0.2 10*3/uL (ref 0.0–0.5)
Eosinophils Relative: 3 %
HCT: 43.6 % (ref 36.0–46.0)
Hemoglobin: 14.5 g/dL (ref 12.0–15.0)
Immature Granulocytes: 0 %
Lymphocytes Relative: 21 %
Lymphs Abs: 1.4 10*3/uL (ref 0.7–4.0)
MCH: 30.5 pg (ref 26.0–34.0)
MCHC: 33.3 g/dL (ref 30.0–36.0)
MCV: 91.6 fL (ref 80.0–100.0)
Monocytes Absolute: 0.6 10*3/uL (ref 0.1–1.0)
Monocytes Relative: 9 %
Neutro Abs: 4.2 10*3/uL (ref 1.7–7.7)
Neutrophils Relative %: 66 %
Platelets: 129 10*3/uL — ABNORMAL LOW (ref 150–400)
RBC: 4.76 MIL/uL (ref 3.87–5.11)
RDW: 13.1 % (ref 11.5–15.5)
WBC: 6.3 10*3/uL (ref 4.0–10.5)
nRBC: 0 % (ref 0.0–0.2)

## 2021-04-03 ENCOUNTER — Ambulatory Visit (HOSPITAL_COMMUNITY): Payer: BC Managed Care – PPO | Admitting: Physician Assistant

## 2021-04-12 ENCOUNTER — Ambulatory Visit
Admission: RE | Admit: 2021-04-12 | Discharge: 2021-04-12 | Disposition: A | Discharge status: Home / Self Care | Payer: BC Managed Care – PPO | Source: Ambulatory Visit | Attending: Family Medicine | Admitting: Family Medicine

## 2021-04-12 ENCOUNTER — Other Ambulatory Visit: Payer: Self-pay

## 2021-04-12 DIAGNOSIS — Z1231 Encounter for screening mammogram for malignant neoplasm of breast: Secondary | ICD-10-CM

## 2021-05-10 ENCOUNTER — Other Ambulatory Visit (HOSPITAL_COMMUNITY): Payer: Self-pay | Admitting: *Deleted

## 2021-05-10 DIAGNOSIS — E063 Autoimmune thyroiditis: Secondary | ICD-10-CM

## 2021-05-10 DIAGNOSIS — D696 Thrombocytopenia, unspecified: Secondary | ICD-10-CM

## 2021-05-10 DIAGNOSIS — E038 Other specified hypothyroidism: Secondary | ICD-10-CM

## 2021-05-11 ENCOUNTER — Other Ambulatory Visit: Payer: Self-pay

## 2021-05-11 ENCOUNTER — Inpatient Hospital Stay (HOSPITAL_COMMUNITY): Payer: BC Managed Care – PPO | Attending: Hematology

## 2021-05-11 DIAGNOSIS — D696 Thrombocytopenia, unspecified: Secondary | ICD-10-CM | POA: Insufficient documentation

## 2021-05-11 DIAGNOSIS — E063 Autoimmune thyroiditis: Secondary | ICD-10-CM | POA: Insufficient documentation

## 2021-05-11 DIAGNOSIS — E038 Other specified hypothyroidism: Secondary | ICD-10-CM

## 2021-05-11 DIAGNOSIS — Z79899 Other long term (current) drug therapy: Secondary | ICD-10-CM | POA: Diagnosis not present

## 2021-05-11 DIAGNOSIS — K7581 Nonalcoholic steatohepatitis (NASH): Secondary | ICD-10-CM | POA: Insufficient documentation

## 2021-05-11 LAB — CBC WITH DIFFERENTIAL/PLATELET
Abs Immature Granulocytes: 0.01 10*3/uL (ref 0.00–0.07)
Basophils Absolute: 0 10*3/uL (ref 0.0–0.1)
Basophils Relative: 1 %
Eosinophils Absolute: 0.2 10*3/uL (ref 0.0–0.5)
Eosinophils Relative: 4 %
HCT: 43.6 % (ref 36.0–46.0)
Hemoglobin: 15 g/dL (ref 12.0–15.0)
Immature Granulocytes: 0 %
Lymphocytes Relative: 23 %
Lymphs Abs: 1.1 10*3/uL (ref 0.7–4.0)
MCH: 30.6 pg (ref 26.0–34.0)
MCHC: 34.4 g/dL (ref 30.0–36.0)
MCV: 89 fL (ref 80.0–100.0)
Monocytes Absolute: 0.4 10*3/uL (ref 0.1–1.0)
Monocytes Relative: 8 %
Neutro Abs: 3.1 10*3/uL (ref 1.7–7.7)
Neutrophils Relative %: 64 %
Platelets: 114 10*3/uL — ABNORMAL LOW (ref 150–400)
RBC: 4.9 MIL/uL (ref 3.87–5.11)
RDW: 13.2 % (ref 11.5–15.5)
WBC: 4.8 10*3/uL (ref 4.0–10.5)
nRBC: 0 % (ref 0.0–0.2)

## 2021-05-11 LAB — TSH: TSH: 2.384 u[IU]/mL (ref 0.350–4.500)

## 2021-05-11 LAB — VITAMIN B12: Vitamin B-12: 508 pg/mL (ref 180–914)

## 2021-05-11 LAB — LACTATE DEHYDROGENASE: LDH: 216 U/L — ABNORMAL HIGH (ref 98–192)

## 2021-05-17 NOTE — Progress Notes (Signed)
[place], [place] [address]   CLINIC:  Medical Oncology/Hematology  PCP:  Girtha Rm, NP-C [address] [place] [address] [phone]   REASON FOR VISIT:  Follow-up for thrombocytopenia  PRIOR THERAPY: None  CURRENT THERAPY: Surveillance  INTERVAL HISTORY:  Ms. Hollick 65 y.o. female returns for routine follow-up of thrombocytopenia.  She was last seen by Dr. Chryl Heck on 03/03/2019.  At today's visit, she reports feeling well.  No recent hospitalizations, surgeries, or changes in baseline health status.  She denies any bleeding, bruising, or petechial rash; no epistaxis, hematemesis, hematochezia, or melena.  She denies abdominal pain or early satiety.  No B symptoms such as fever, chills, night sweats, unintentional weight loss.  Her only complaint today is right knee pain, and chronic mild shortness of breath on exertion.  She has 100% energy and 100% appetite. She endorses that she is maintaining a stable weight.   REVIEW OF SYSTEMS:  Review of Systems  Constitutional:  Negative for appetite change, chills, diaphoresis, fatigue, fever and unexpected weight change.  HENT:   Negative for lump/mass and nosebleeds.   Eyes:  Negative for eye problems.  Respiratory:  Positive for shortness of breath (With exertion). Negative for cough and hemoptysis.   Cardiovascular:  Negative for chest pain, leg swelling and palpitations.  Gastrointestinal:  Negative for abdominal pain, blood in stool, constipation, diarrhea, nausea and vomiting.  Genitourinary:  Negative for hematuria.   Musculoskeletal:  Positive for arthralgias (Right knee).  Skin: Negative.   Neurological:  Negative for dizziness, headaches and light-headedness.  Hematological:  Does not bruise/bleed easily.     PAST MEDICAL/SURGICAL HISTORY:  Past Medical History:  Diagnosis Date   Elevated liver enzymes    Enlarged thyroid    Hypothyroidism due to Hashimoto's  thyroiditis    Lichen planus    NASH (nonalcoholic steatohepatitis)    Vitamin D deficiency 08/03/2018   Past Surgical History:  Procedure Laterality Date   CESAREAN SECTION     x3   CHOLECYSTECTOMY  2000   TOTAL ABDOMINAL HYSTERECTOMY W/ BILATERAL SALPINGOOPHORECTOMY  1998   fibroids     SOCIAL HISTORY:  Social History   Socioeconomic History   Marital status: Married    Spouse name: Not on file   Number of children: Not on file   Years of education: Not on file   Highest education level: Not on file  Occupational History   Not on file  Tobacco Use   Smoking status: Never   Smokeless tobacco: Never  Substance and Sexual Activity   Alcohol use: No   Drug use: No   Sexual activity: Yes    Partners: Male    Birth control/protection: None  Other Topics Concern   Not on file  Social History Narrative   Not on file   Social Determinants of Health   Financial Resource Strain: Not on file  Food Insecurity: Not on file  Transportation Needs: Not on file  Physical Activity: Not on file  Stress: Not on file  Social Connections: Not on file  Intimate Partner Violence: Not on file    FAMILY HISTORY:  Family History  Problem Relation Age of Onset   Breast cancer Neg Hx     CURRENT MEDICATIONS:  Outpatient Encounter Medications as of 05/18/2021  Medication Sig Note   Cetirizine HCl (ZYRTEC PO) Take by mouth.    diphenhydrAMINE (BENADRYL) 50 MG capsule  (Patient not taking: Reported on 03/12/2021)  EPINEPHrine 0.3 mg/0.3 mL IJ SOAJ injection Inject into the muscle. (Patient not taking: Reported on 03/12/2021)    levothyroxine (SYNTHROID) 50 MCG tablet Take 1 tablet (50 mcg total) by mouth daily.    Milk Thistle 1000 MG CAPS Take 1 capsule by mouth daily.    nitrofurantoin, macrocrystal-monohydrate, (MACROBID) 100 MG capsule Take 1 capsule (100 mg total) by mouth 2 (two) times daily.    Phenazopyridine HCl (AZO URINARY PAIN PO) Take 2 tablets by mouth as needed.  (Patient not taking: Reported on 03/12/2021) 03/12/2021: Finished-took for the 2 days as directed   Probiotic Product (PROBIOTIC DAILY PO) Take 1 capsule by mouth daily.    venlafaxine XR (EFFEXOR-XR) 75 MG 24 hr capsule Take 1 capsule (75 mg total) by mouth daily.    VITAMIN D PO Take 2,000 Int'l Units by mouth daily.    No facility-administered encounter medications on file as of 05/18/2021.    ALLERGIES:  Allergies  Allergen Reactions   Other Anaphylaxis and Other (See Comments)    Bee and wasp sting     PHYSICAL EXAM:  ECOG PERFORMANCE STATUS: 0 - Asymptomatic  There were no vitals filed for this visit. There were no vitals filed for this visit. Physical Exam Constitutional:      Appearance: Normal appearance. She is morbidly obese.  HENT:     Head: Normocephalic and atraumatic.     Mouth/Throat:     Mouth: Mucous membranes are moist.  Eyes:     Extraocular Movements: Extraocular movements intact.     Pupils: Pupils are equal, round, and reactive to light.  Cardiovascular:     Rate and Rhythm: Normal rate and regular rhythm.     Pulses: Normal pulses.     Heart sounds: Normal heart sounds.  Pulmonary:     Effort: Pulmonary effort is normal.     Breath sounds: Normal breath sounds.  Abdominal:     General: Bowel sounds are normal.     Palpations: Abdomen is soft.     Tenderness: There is no abdominal tenderness.  Musculoskeletal:        General: No swelling.     Right lower leg: No edema.     Left lower leg: No edema.  Lymphadenopathy:     Cervical: No cervical adenopathy.  Skin:    General: Skin is warm and dry.  Neurological:     General: No focal deficit present.     Mental Status: She is alert and oriented to person, place, and time.  Psychiatric:        Mood and Affect: Mood normal.        Behavior: Behavior normal.     LABORATORY DATA:  I have reviewed the labs as listed.  CBC    Component Value Date/Time   WBC 4.8 05/11/2021 1107   RBC 4.90  05/11/2021 1107   HGB 15.0 05/11/2021 1107   HGB 14.3 12/14/2020 1323   HCT 43.6 05/11/2021 1107   HCT 44.4 02/02/2021 1328   PLT 114 (L) 05/11/2021 1107   PLT 136 (L) 12/14/2020 1323   MCV 89.0 05/11/2021 1107   MCV 91 12/14/2020 1323   MCH 30.6 05/11/2021 1107   MCHC 34.4 05/11/2021 1107   RDW 13.2 05/11/2021 1107   RDW 12.8 12/14/2020 1323   LYMPHSABS 1.1 05/11/2021 1107   LYMPHSABS 1.1 12/14/2020 1323   MONOABS 0.4 05/11/2021 1107   EOSABS 0.2 05/11/2021 1107   EOSABS 0.2 12/14/2020 1323   BASOSABS 0.0 05/11/2021  1107   BASOSABS 0.1 12/14/2020 1323   CMP Latest Ref Rng & Units 12/14/2020 09/22/2019 08/03/2018  Glucose 65 - 99 mg/dL 101(H) 87 72  BUN 8 - 27 mg/dL 8 12 10   Creatinine 0.57 - 1.00 mg/dL 0.59 0.63 0.56(L)  Sodium 134 - 144 mmol/L 144 144 143  Potassium 3.5 - 5.2 mmol/L 4.3 4.9 3.8  Chloride 96 - 106 mmol/L 105 105 106  CO2 20 - 29 mmol/L 25 24 21   Calcium 8.7 - 10.3 mg/dL 9.7 9.9 9.5  Total Protein 6.0 - 8.5 g/dL 7.1 7.0 7.3  Total Bilirubin 0.0 - 1.2 mg/dL 0.9 0.7 0.8  Alkaline Phos 44 - 121 IU/L 111 114 91  AST 0 - 40 IU/L 27 24 31   ALT 0 - 32 IU/L 22 24 24     DIAGNOSTIC IMAGING:  I have independently reviewed the relevant imaging and discussed with the patient.  ASSESSMENT & PLAN: 1.  Thrombocytopenia, mild - Labs suggest mild thrombocytopenia since December 2020, but work-up did not reveal any other major evidence of nutritional deficiency, hemolysis, hepatitis, or hypothyroidism - Normal iron, B12, folate, LDH, reticulocytes; hepatitis panel negative.  Normal TSH. - Abdominal ultrasound (01/30/2021): Fatty infiltration of the liver, spleen not evaluated on this exam - Differential diagnosis favors mild ITP versus sequela of known fatty liver disease with possible splenomegaly - Denies any bleeding, bruising, or B symptoms - Most recent labs (05/11/2021): Platelets 114, other blood counts and differential normal; normal B12; LDH mildly elevated at 216;  TSH normal - PLAN: Discussed with patient that we will plan on ongoing surveillance of her platelets, and that no treatment is indicated except in cases of severe thrombocytopenia (platelets < 30) or with major bleeding episodes and before certain surgeries.  Consider abdominal imaging to evaluate for splenomegaly.  If significant changes in platelets or other blood counts, would consider bone marrow biopsy.  RTC in 6 months with repeat labs.   PLAN SUMMARY & DISPOSITION: -Labs and RTC in 6 months  All questions were answered. The patient knows to call the clinic with any problems, questions or concerns.  Medical decision making: Low  Time spent on visit: I spent 20 minutes counseling the patient face to face. The total time spent in the appointment was 30 minutes and more than 50% was on counseling.   Harriett Rush, PA-C  05/18/2021 1:44 PM

## 2021-05-18 ENCOUNTER — Inpatient Hospital Stay (HOSPITAL_COMMUNITY): Payer: BC Managed Care – PPO | Admitting: Physician Assistant

## 2021-05-18 ENCOUNTER — Other Ambulatory Visit: Payer: Self-pay

## 2021-05-18 VITALS — BP 138/62 | HR 110 | Temp 98.7°F | Resp 16 | Wt 276.4 lb

## 2021-05-18 DIAGNOSIS — E063 Autoimmune thyroiditis: Secondary | ICD-10-CM | POA: Diagnosis not present

## 2021-05-18 DIAGNOSIS — Z79899 Other long term (current) drug therapy: Secondary | ICD-10-CM | POA: Diagnosis not present

## 2021-05-18 DIAGNOSIS — D696 Thrombocytopenia, unspecified: Secondary | ICD-10-CM | POA: Diagnosis not present

## 2021-05-18 DIAGNOSIS — K7581 Nonalcoholic steatohepatitis (NASH): Secondary | ICD-10-CM | POA: Diagnosis not present

## 2021-05-18 NOTE — Patient Instructions (Signed)
[place] at [facility] Discharge Instructions  You were seen today by Tarri Abernethy PA-C for your low platelets.  Your platelets are mildly low, and remained stable within their baseline range.  As we have discussed, this may be related to some mild immune system dysfunction that is causing your body to attack some of your platelets.  Another possibility is that you have an enlarged spleen related to your fatty liver disease.  Either way, we will continue to watch and wait.  We will see you for repeat labs and office visit in 6 months.  However, if you notice any of the following symptoms, please call our clinic to request labs and office visit sooner rather than later. - Abnormal bleeding, severe bruising, or petechial rash - Unexplained fever, chills, night sweats, unintentional weight loss  LABS: Return in 6 months for repeat labs  OTHER TESTS: None at this time  MEDICATIONS: No changes to home medications  FOLLOW-UP APPOINTMENT: Office visit in 6 months   Thank you for choosing [place] at [facility] to provide your oncology and hematology care.  To afford each patient quality time with our provider, please arrive at least 15 minutes before your scheduled appointment time.   If you have a lab appointment with the [place] please come in thru the Main Entrance and check in at the main information desk.  You need to re-schedule your appointment should you arrive 10 or more minutes late.  We strive to give you quality time with our providers, and arriving late affects you and other patients whose appointments are after yours.  Also, if you no show three or more times for appointments you may be dismissed from the clinic at the providers discretion.     Again, thank you for choosing [facility].  Our hope is that these requests will decrease the amount of time that you wait before being seen by our physicians.        _____________________________________________________________  Should you have questions after your visit to [facility], please contact our office at [phone] and follow the prompts.  Our office hours are 8:00 a.m. and 4:30 p.m. Monday - Friday.  Please note that voicemails left after 4:00 p.m. may not be returned until the following business day.  We are closed weekends and major holidays.  You do have access to a nurse 24-7, just call the main number to the clinic [phone] and do not press any options, hold on the line and a nurse will answer the phone.    For prescription refill requests, have your pharmacy contact our office and allow 72 hours.    Due to Covid, you will need to wear a mask upon entering the hospital. If you do not have a mask, a mask will be given to you at the Main Entrance upon arrival. For doctor visits, patients may have 1 support person age 21 or older with them. For treatment visits, patients can not have anyone with them due to social distancing guidelines and our immunocompromised population.

## 2021-08-17 ENCOUNTER — Telehealth: Payer: Self-pay

## 2021-08-17 MED ORDER — VENLAFAXINE HCL ER 75 MG PO CP24: 75.0000 mg | ORAL_CAPSULE | Freq: Every day | ORAL | 1 refills | Status: DC

## 2021-08-17 NOTE — Telephone Encounter (Signed)
Received fax from [facility] for a refill on the pts. Venlafaxine pts. Last apt. Was 03/12/21.

## 2021-11-15 ENCOUNTER — Encounter: Payer: Self-pay | Admitting: Physician Assistant

## 2021-11-15 ENCOUNTER — Other Ambulatory Visit: Payer: Self-pay

## 2021-11-15 ENCOUNTER — Ambulatory Visit: Payer: BC Managed Care – PPO | Admitting: Physician Assistant

## 2021-11-15 VITALS — BP 120/72 | HR 101 | Ht 63.0 in | Wt 279.6 lb

## 2021-11-15 DIAGNOSIS — F329 Major depressive disorder, single episode, unspecified: Secondary | ICD-10-CM

## 2021-11-15 DIAGNOSIS — K7581 Nonalcoholic steatohepatitis (NASH): Secondary | ICD-10-CM

## 2021-11-15 DIAGNOSIS — E063 Autoimmune thyroiditis: Secondary | ICD-10-CM

## 2021-11-15 DIAGNOSIS — D696 Thrombocytopenia, unspecified: Secondary | ICD-10-CM | POA: Diagnosis not present

## 2021-11-15 DIAGNOSIS — F418 Other specified anxiety disorders: Secondary | ICD-10-CM | POA: Diagnosis not present

## 2021-11-15 DIAGNOSIS — E038 Other specified hypothyroidism: Secondary | ICD-10-CM

## 2021-11-15 DIAGNOSIS — K760 Fatty (change of) liver, not elsewhere classified: Secondary | ICD-10-CM | POA: Insufficient documentation

## 2021-11-15 HISTORY — DX: Major depressive disorder, single episode, unspecified: F32.9

## 2021-11-15 MED ORDER — VENLAFAXINE HCL ER 75 MG PO CP24: 150.0000 mg | ORAL_CAPSULE | Freq: Every day | ORAL | 1 refills | Status: DC

## 2021-11-15 MED ORDER — LEVOTHYROXINE SODIUM 50 MCG PO TABS: 50.0000 ug | ORAL_TABLET | Freq: Every day | ORAL | 1 refills | Status: DC

## 2021-11-15 NOTE — Progress Notes (Signed)
Acute Office Visit  Subjective:    Patient ID: Krista Kelley, female    DOB: 1955-11-06, 66 y.o.   MRN: [id number]  Chief Complaint  Patient presents with   Hypothyroidism    Medication follow up    Anxiety    Patient states she has been taking 2 effexors per day- states she used to take 225 mg. She states insurance will only pay for 75 mg capsules    HPI Patient is in today for a follow up appointment. Requests to take Effexor XR 75 mg, 2 daily; has tolerated well in the past.   Followed by Hematology for thrombocytopenia. Followed by Orthopedics for right knee OA.  Past Medical History:  Diagnosis Date   Elevated liver enzymes    Enlarged thyroid    Hypothyroidism due to Hashimoto's thyroiditis    Lichen planus    NASH (nonalcoholic steatohepatitis)    Vitamin D deficiency 08/03/2018    Past Surgical History:  Procedure Laterality Date   CESAREAN SECTION     x3   CHOLECYSTECTOMY  2000   TOTAL ABDOMINAL HYSTERECTOMY W/ BILATERAL SALPINGOOPHORECTOMY  1998   fibroids    Family History  Problem Relation Age of Onset   Breast cancer Neg Hx     Social History   Socioeconomic History   Marital status: Married    Spouse name: Not on file   Number of children: Not on file   Years of education: Not on file   Highest education level: Not on file  Occupational History   Not on file  Tobacco Use   Smoking status: Never   Smokeless tobacco: Never  Substance and Sexual Activity   Alcohol use: No   Drug use: No   Sexual activity: Yes    Partners: Male    Birth control/protection: None  Other Topics Concern   Not on file  Social History Narrative   Not on file   Social Determinants of Health   Financial Resource Strain: Not on file  Food Insecurity: Not on file  Transportation Needs: Not on file  Physical Activity: Not on file  Stress: Not on file  Social Connections: Not on file  Intimate Partner Violence: Not on file    Outpatient Medications Prior  to Visit  Medication Sig Dispense Refill   Cetirizine HCl (ZYRTEC PO) Take by mouth.     Milk Thistle 1000 MG CAPS Take 1 capsule by mouth daily.     Probiotic Product (PROBIOTIC DAILY PO) Take 1 capsule by mouth daily.     VITAMIN D PO Take 2,000 Int'l Units by mouth daily.     levothyroxine (SYNTHROID) 50 MCG tablet Take 1 tablet (50 mcg total) by mouth daily. 90 tablet 1   venlafaxine XR (EFFEXOR-XR) 75 MG 24 hr capsule Take 1 capsule (75 mg total) by mouth daily. 90 capsule 1   diphenhydrAMINE (BENADRYL) 50 MG capsule      EPINEPHrine 0.3 mg/0.3 mL IJ SOAJ injection Inject into the muscle.     No facility-administered medications prior to visit.    Allergies  Allergen Reactions   Other Anaphylaxis and Other (See Comments)    Bee and wasp sting    Review of Systems  Constitutional:  Negative for activity change and chills.  HENT:  Negative for congestion and voice change.   Eyes:  Negative for pain and redness.  Respiratory:  Negative for cough and wheezing.   Cardiovascular:  Negative for chest pain.  Gastrointestinal:  Negative for  constipation, diarrhea, nausea and vomiting.  Endocrine: Negative for polyuria.  Genitourinary:  Negative for frequency.  Skin:  Negative for color change and rash. Wound: .Marland Kitchen Allergic/Immunologic: Negative for immunocompromised state.  Neurological:  Negative for dizziness.  Psychiatric/Behavioral:  Negative for agitation.       Objective:    Physical Exam Constitutional:      General: She is not in acute distress.    Appearance: Normal appearance. She is not ill-appearing.  HENT:     Head: Normocephalic and atraumatic.     Right Ear: External ear normal.     Left Ear: External ear normal.     Nose: No congestion.  Eyes:     Extraocular Movements: Extraocular movements intact.     Conjunctiva/sclera: Conjunctivae normal.     Pupils: Pupils are equal, round, and reactive to light.  Cardiovascular:     Rate and Rhythm: Normal rate and  regular rhythm.     Pulses: Normal pulses.     Heart sounds: Normal heart sounds.  Pulmonary:     Effort: Pulmonary effort is normal.     Breath sounds: Normal breath sounds. No wheezing.  Abdominal:     General: Bowel sounds are normal.     Palpations: Abdomen is soft.  Musculoskeletal:     Cervical back: Normal range of motion and neck supple.     Right lower leg: No edema.     Left lower leg: No edema.  Skin:    General: Skin is warm and dry.     Findings: No bruising.  Neurological:     General: No focal deficit present.     Mental Status: She is alert and oriented to person, place, and time.  Psychiatric:        Mood and Affect: Mood normal.        Behavior: Behavior normal.        Thought Content: Thought content normal.    BP 120/72 (BP Location: Left Arm, Patient Position: Sitting)    Pulse (!) 101    Ht 5' 3"  (1.6 m)    Wt 279 lb 9.6 oz (126.8 kg)    SpO2 98%    BMI 49.53 kg/m  Wt Readings from Last 3 Encounters:  11/15/21 279 lb 9.6 oz (126.8 kg)  05/18/21 276 lb 6.4 oz (125.4 kg)  03/02/21 272 lb 9.6 oz (123.7 kg)    There are no preventive care reminders to display for this patient.   There are no preventive care reminders to display for this patient.   Lab Results  Component Value Date   TSH 2.384 05/11/2021   Lab Results  Component Value Date   WBC 4.8 05/11/2021   HGB 15.0 05/11/2021   HCT 43.6 05/11/2021   MCV 89.0 05/11/2021   PLT 114 (L) 05/11/2021   Lab Results  Component Value Date   NA 144 12/14/2020   K 4.3 12/14/2020   CO2 25 12/14/2020   GLUCOSE 101 (H) 12/14/2020   BUN 8 12/14/2020   CREATININE 0.59 12/14/2020   BILITOT 0.9 12/14/2020   ALKPHOS 111 12/14/2020   AST 27 12/14/2020   ALT 22 12/14/2020   PROT 7.1 12/14/2020   ALBUMIN 4.0 12/14/2020   CALCIUM 9.7 12/14/2020   EGFR 102 12/14/2020   Lab Results  Component Value Date   CHOL 224 (H) 12/14/2020   Lab Results  Component Value Date   HDL 46 12/14/2020   Lab  Results  Component Value Date  [place] 151 (H) 12/14/2020   Lab Results  Component Value Date   TRIG 148 12/14/2020   Lab Results  Component Value Date   CHOLHDL 4.9 (H) 12/14/2020   Lab Results  Component Value Date   HGBA1C 5.5 08/03/2018       Assessment & Plan:   Problem List Items Addressed This Visit       Digestive   NASH (nonalcoholic steatohepatitis)   Relevant Orders   Comprehensive metabolic panel     Endocrine   Hypothyroidism due to Hashimoto's thyroiditis - Primary   Relevant Medications   levothyroxine (SYNTHROID) 50 MCG tablet     Hematopoietic and Hemostatic   Thrombocytopenia (HCC)   Relevant Orders   CBC with Differential/Platelet     Other   Anxiety with depression   Relevant Medications   venlafaxine XR (EFFEXOR-XR) 75 MG 24 hr capsule   Return in 6 months for annua exam.  Meds ordered this encounter  Medications   venlafaxine XR (EFFEXOR-XR) 75 MG 24 hr capsule    Sig: Take 2 capsules (150 mg total) by mouth daily.    Dispense:  180 capsule    Refill:  1    Order Specific Question:   Supervising Provider    Answer:   Denita Lung [[id number]]   levothyroxine (SYNTHROID) 50 MCG tablet    Sig: Take 1 tablet (50 mcg total) by mouth daily.    Dispense:  90 tablet    Refill:  1    Order Specific Question:   Supervising Provider    Answer:   Denita Lung [place], PA-C

## 2021-11-16 LAB — COMPREHENSIVE METABOLIC PANEL
ALT: 26 IU/L (ref 0–32)
AST: 38 IU/L (ref 0–40)
Albumin/Globulin Ratio: 1.4 (ref 1.2–2.2)
Albumin: 4.1 g/dL (ref 3.8–4.8)
Alkaline Phosphatase: 111 IU/L (ref 44–121)
BUN/Creatinine Ratio: 15 (ref 12–28)
BUN: 8 mg/dL (ref 8–27)
Bilirubin Total: 0.9 mg/dL (ref 0.0–1.2)
CO2: 23 mmol/L (ref 20–29)
Calcium: 9.4 mg/dL (ref 8.7–10.3)
Chloride: 106 mmol/L (ref 96–106)
Creatinine, Ser: 0.54 mg/dL — ABNORMAL LOW (ref 0.57–1.00)
Globulin, Total: 3 g/dL (ref 1.5–4.5)
Glucose: 84 mg/dL (ref 70–99)
Potassium: 4.1 mmol/L (ref 3.5–5.2)
Sodium: 145 mmol/L — ABNORMAL HIGH (ref 134–144)
Total Protein: 7.1 g/dL (ref 6.0–8.5)
eGFR: 103 mL/min/{1.73_m2} (ref >59)

## 2021-11-16 LAB — CBC WITH DIFFERENTIAL/PLATELET
Basophils Absolute: 0.1 10*3/uL (ref 0.0–0.2)
Basos: 1 %
EOS (ABSOLUTE): 0.2 10*3/uL (ref 0.0–0.4)
Eos: 3 %
Hematocrit: 43.6 % (ref 34.0–46.6)
Hemoglobin: 15 g/dL (ref 11.1–15.9)
Immature Grans (Abs): 0 10*3/uL (ref 0.0–0.1)
Immature Granulocytes: 0 %
Lymphocytes Absolute: 1.5 10*3/uL (ref 0.7–3.1)
Lymphs: 22 %
MCH: 30.4 pg (ref 26.6–33.0)
MCHC: 34.4 g/dL (ref 31.5–35.7)
MCV: 88 fL (ref 79–97)
Monocytes Absolute: 0.5 10*3/uL (ref 0.1–0.9)
Monocytes: 8 %
Neutrophils Absolute: 4.5 10*3/uL (ref 1.4–7.0)
Neutrophils: 66 %
Platelets: 131 10*3/uL — ABNORMAL LOW (ref 150–450)
RBC: 4.93 x10E6/uL (ref 3.77–5.28)
RDW: 13 % (ref 11.7–15.4)
WBC: 6.8 10*3/uL (ref 3.4–10.8)

## 2021-11-23 ENCOUNTER — Ambulatory Visit (HOSPITAL_COMMUNITY): Payer: BC Managed Care – PPO | Admitting: Physician Assistant

## 2021-11-23 ENCOUNTER — Other Ambulatory Visit (HOSPITAL_COMMUNITY): Payer: BC Managed Care – PPO

## 2021-11-27 ENCOUNTER — Inpatient Hospital Stay (HOSPITAL_COMMUNITY): Payer: BC Managed Care – PPO

## 2021-11-27 ENCOUNTER — Ambulatory Visit (HOSPITAL_COMMUNITY): Payer: BC Managed Care – PPO | Admitting: Physician Assistant

## 2021-12-11 ENCOUNTER — Ambulatory Visit: Payer: BC Managed Care – PPO | Admitting: Physician Assistant

## 2022-03-25 ENCOUNTER — Other Ambulatory Visit: Payer: Self-pay | Admitting: Physician Assistant

## 2022-03-25 ENCOUNTER — Telehealth: Payer: Self-pay | Admitting: Physician Assistant

## 2022-03-25 MED ORDER — EPINEPHRINE 0.3 MG/0.3ML IJ SOAJ: 0.3000 mg | INTRAMUSCULAR | 1 refills | Status: DC | PRN

## 2022-03-25 NOTE — Telephone Encounter (Signed)
Refill of epipen ordered

## 2022-03-25 NOTE — Telephone Encounter (Signed)
Needs Epipen refill she has to go out of town and needs to have  [employer]

## 2022-04-05 ENCOUNTER — Encounter: Payer: Self-pay | Admitting: Internal Medicine

## 2022-05-17 ENCOUNTER — Encounter: Payer: BC Managed Care – PPO | Admitting: Physician Assistant

## 2022-06-12 ENCOUNTER — Encounter: Payer: Self-pay | Admitting: Internal Medicine

## 2022-07-16 ENCOUNTER — Encounter: Payer: Self-pay | Admitting: Internal Medicine

## 2022-07-24 ENCOUNTER — Other Ambulatory Visit: Payer: Self-pay | Admitting: Physician Assistant

## 2022-07-24 DIAGNOSIS — Z1231 Encounter for screening mammogram for malignant neoplasm of breast: Secondary | ICD-10-CM

## 2022-08-20 ENCOUNTER — Encounter: Payer: Self-pay | Admitting: Internal Medicine

## 2022-09-03 ENCOUNTER — Encounter: Payer: Self-pay | Admitting: Nurse Practitioner

## 2022-09-03 ENCOUNTER — Ambulatory Visit: Payer: BC Managed Care – PPO | Admitting: Nurse Practitioner

## 2022-09-03 VITALS — BP 120/68 | HR 99 | Wt 280.8 lb

## 2022-09-03 DIAGNOSIS — D696 Thrombocytopenia, unspecified: Secondary | ICD-10-CM

## 2022-09-03 DIAGNOSIS — R748 Abnormal levels of other serum enzymes: Secondary | ICD-10-CM | POA: Diagnosis not present

## 2022-09-03 DIAGNOSIS — M79605 Pain in left leg: Secondary | ICD-10-CM

## 2022-09-03 DIAGNOSIS — E063 Autoimmune thyroiditis: Secondary | ICD-10-CM

## 2022-09-03 DIAGNOSIS — E038 Other specified hypothyroidism: Secondary | ICD-10-CM

## 2022-09-03 DIAGNOSIS — K7581 Nonalcoholic steatohepatitis (NASH): Secondary | ICD-10-CM

## 2022-09-03 DIAGNOSIS — E559 Vitamin D deficiency, unspecified: Secondary | ICD-10-CM

## 2022-09-03 DIAGNOSIS — F418 Other specified anxiety disorders: Secondary | ICD-10-CM

## 2022-09-03 DIAGNOSIS — M79604 Pain in right leg: Secondary | ICD-10-CM

## 2022-09-03 DIAGNOSIS — E7800 Pure hypercholesterolemia, unspecified: Secondary | ICD-10-CM

## 2022-09-03 DIAGNOSIS — K760 Fatty (change of) liver, not elsewhere classified: Secondary | ICD-10-CM | POA: Diagnosis not present

## 2022-09-03 DIAGNOSIS — E049 Nontoxic goiter, unspecified: Secondary | ICD-10-CM | POA: Diagnosis not present

## 2022-09-03 DIAGNOSIS — L439 Lichen planus, unspecified: Secondary | ICD-10-CM

## 2022-09-03 HISTORY — DX: Pain in right leg: M79.605

## 2022-09-03 HISTORY — DX: Pain in right leg: M79.604

## 2022-09-03 MED ORDER — VENLAFAXINE HCL ER 75 MG PO CP24: 150.0000 mg | ORAL_CAPSULE | Freq: Every day | ORAL | 3 refills | Status: DC

## 2022-09-03 MED ORDER — EPINEPHRINE 0.3 MG/0.3ML IJ SOAJ: 0.3000 mg | INTRAMUSCULAR | 2 refills | Status: DC | PRN

## 2022-09-03 NOTE — Assessment & Plan Note (Signed)
Chronic. Will monitor labs today. Patient aware of weight management recommendations for optimal control. She plans to increase her physical activity once she reduces the pain in her legs. No alarm sx present at this time. No signs of jaundice or portal HTN. Will continue to monitor.

## 2022-09-03 NOTE — Assessment & Plan Note (Signed)
Historical. Will monitor labs today. Possibly a component of the weakness and pain in her LE's. Would like to rule this out.

## 2022-09-03 NOTE — Patient Instructions (Signed)
We will check your labs and make sure everything looks ok. Hopefully we will find something easily fixed!   I will send in a refill of the levothyroxine once we get the labs back.   If you have any questions or needs please let me know.

## 2022-09-03 NOTE — Progress Notes (Signed)
Krista Howard Bunte, DNP, AGNP-c  ESTABLISHED PATIENT- Chronic Health and/or Follow-Up Visit  Blood pressure 120/68, pulse 99, weight 280 lb 12.8 oz (127.4 kg).    Krista Kelley is a 66 y.o. year old female presenting today for evaluation and management of the following: med check (Med check- no concerns,  since getting covid vaccines 2 years ago. 2 weeks after the shot- started having a lot of pain and weakness in legs. Would like another script for epi pen to local pharmacy/)  Mood Krista Kelley has been taking venlafaxine for several years for management of her anxiety and depressive symptoms. She tells me that this medication has been significantly beneficial and she has not had any new or worsening mood changes in the recent past. She denies SI/HI. She endorses that at one time a provider took her off of the medication and she did not have a good response. She would like to continue on the medication at this time. Her insurance requires that she have 2-46m capsules as opposed to 150 mg capsules or tablets for coverage.   Hypothyroid PShivaunreports good compliance with her medication without missed doses. She denies any new thyroid symptoms at this time. She has not had the labs checked in a while and would like to have this done, if possible. She will be in need of refills in about a month.   Leg Pain PLeyahreports that 2 weeks following her second covid vaccine she began to experience bilateral knee pain. Since that time (approximately 2 years) she endorses worsening pain in the knees, the thighs, and into the calves of her legs. She has seen Dr. BNinfa Lindenwith OConcepcion Livingand was told she has arthritis in her knees. She is concerned that this could be reactive arthritis from the vaccine. She would like labs today to see if there are any concerning findings. She tells me her legs feel heavy at times and she is unable to walk short distances without resting. She has no LE edema, varicosities, discoloration,  or temperature alteration in her LE's.   All ROS negative with exception of what is listed above.   PHYSICAL EXAM Physical Exam Vitals and nursing note reviewed.  Constitutional:      General: She is not in acute distress.    Appearance: Normal appearance. She is obese.  HENT:     Head: Normocephalic.  Eyes:     Extraocular Movements: Extraocular movements intact.     Conjunctiva/sclera: Conjunctivae normal.     Pupils: Pupils are equal, round, and reactive to light.  Neck:     Vascular: No carotid bruit.  Cardiovascular:     Rate and Rhythm: Normal rate and regular rhythm.     Pulses: Normal pulses.     Heart sounds: Normal heart sounds. No murmur heard. Pulmonary:     Effort: Pulmonary effort is normal.     Breath sounds: Normal breath sounds. No wheezing.  Abdominal:     General: Bowel sounds are normal. There is no distension.     Palpations: Abdomen is soft.     Tenderness: There is no abdominal tenderness. There is no guarding.  Musculoskeletal:        General: No swelling or signs of injury. Normal range of motion.     Cervical back: Normal range of motion and neck supple.     Right lower leg: No edema.     Left lower leg: No edema.     Comments: No abnormality noted to LE with excellent  pedal pulses, coloration, temperature, and movement bilaterally. No signs of venous insufficiency, DVT, or muscle wasting.   Lymphadenopathy:     Cervical: No cervical adenopathy.  Skin:    General: Skin is warm and dry.     Capillary Refill: Capillary refill takes less than 2 seconds.  Neurological:     General: No focal deficit present.     Mental Status: She is alert and oriented to person, place, and time.  Psychiatric:        Mood and Affect: Mood normal.        Behavior: Behavior normal.        Thought Content: Thought content normal.        Judgment: Judgment normal.     PLAN Problem List Items Addressed This Visit     Hypothyroidism due to Hashimoto's thyroiditis -  Primary    Chronic. No alarm sx present at this time. Will monitor labs today for management. Refills needed once lab results have returned. Continue current management and plan for f/u in 6 months or sooner if needed.       Relevant Orders   CBC with Differential/Platelet   Comprehensive metabolic panel   Hemoglobin A1c   Lipid panel   Iron, TIBC and Ferritin Panel   B12 and Folate Panel   VITAMIN D 25 Hydroxy (Vit-D Deficiency, Fractures)   TSH   T4, free   Lichen planus    No new or worsening symptoms at this time. She has been diagnosed with bx from dermatology. Discussion that this is an immune response, but not an autoimmune condition. Recommend monitoring closely for new or worsening symptoms and reporting for treatment.       Relevant Orders   CBC with Differential/Platelet   Comprehensive metabolic panel   Hemoglobin A1c   Lipid panel   Iron, TIBC and Ferritin Panel   B12 and Folate Panel   VITAMIN D 25 Hydroxy (Vit-D Deficiency, Fractures)   TSH   T4, free   Enlarged thyroid    See hypothyroidism      Relevant Orders   CBC with Differential/Platelet   Comprehensive metabolic panel   Hemoglobin A1c   Lipid panel   Iron, TIBC and Ferritin Panel   B12 and Folate Panel   VITAMIN D 25 Hydroxy (Vit-D Deficiency, Fractures)   TSH   T4, free   NASH (nonalcoholic steatohepatitis)    Chronic. Will monitor labs today. Patient aware of weight management recommendations for optimal control. She plans to increase her physical activity once she reduces the pain in her legs. No alarm sx present at this time. No signs of jaundice or portal HTN. Will continue to monitor.       Relevant Orders   CBC with Differential/Platelet   Comprehensive metabolic panel   Hemoglobin A1c   Lipid panel   Iron, TIBC and Ferritin Panel   B12 and Folate Panel   VITAMIN D 25 Hydroxy (Vit-D Deficiency, Fractures)   TSH   T4, free   Elevated liver enzymes    Labs today for monitoring.        Relevant Orders   CBC with Differential/Platelet   Comprehensive metabolic panel   Hemoglobin A1c   Lipid panel   Iron, TIBC and Ferritin Panel   B12 and Folate Panel   VITAMIN D 25 Hydroxy (Vit-D Deficiency, Fractures)   TSH   T4, free   Vitamin D deficiency    Historical. Will monitor labs today. Possibly a component of  the weakness and pain in her LE's. Would like to rule this out.       Relevant Orders   CBC with Differential/Platelet   Comprehensive metabolic panel   Hemoglobin A1c   Lipid panel   Iron, TIBC and Ferritin Panel   B12 and Folate Panel   VITAMIN D 25 Hydroxy (Vit-D Deficiency, Fractures)   TSH   T4, free   Elevated LDL cholesterol level    Fasting labs today for monitoring.       Relevant Orders   CBC with Differential/Platelet   Comprehensive metabolic panel   Hemoglobin A1c   Lipid panel   Iron, TIBC and Ferritin Panel   B12 and Folate Panel   VITAMIN D 25 Hydroxy (Vit-D Deficiency, Fractures)   TSH   T4, free   Thrombocytopenia (HCC)    Chronic. Previous evaluation with hematology. She has not signs of bleeding at this time and no bruising. Etiology unclear. Suspect that there may be an autoimmune component present given her history of hashimotos. Recommend monitoring closely for signs of bleeding and report immediately. Will monitor labs today.      Relevant Orders   CBC with Differential/Platelet   Comprehensive metabolic panel   Hemoglobin A1c   Lipid panel   Iron, TIBC and Ferritin Panel   B12 and Folate Panel   VITAMIN D 25 Hydroxy (Vit-D Deficiency, Fractures)   TSH   T4, free   Anxiety with depression    Chronic. Well controlled with venlafaxine. No changes to plan of care at this time. Recommend continue with current treatment and if she would like to try to taper in the future to let me know so we can plan for a slow taper. No alarm sx present. F/U in 6 months or sooner if needed.       Relevant Medications   venlafaxine XR  (EFFEXOR-XR) 75 MG 24 hr capsule   Pain in both lower extremities    LE heaviness, weakness, and pain with no clear etiology. No vascular symptoms present at this time. Possibly related to vitamin deficiency, autoimmune component, or arthritic pain, although unlikely that this would cause the muscle pain. Will monitor labs today for evaluation and determine next best steps. I would like to help relieve this pain so that she is able to engage in increased physical activity for her overall health. Will make changes based on labs.        Return in about 1 year (around 09/04/2023).   Krista Keeler, DNP, AGNP-c 09/03/2022  1:45 PM

## 2022-09-03 NOTE — Assessment & Plan Note (Signed)
See hypothyroidism

## 2022-09-03 NOTE — Assessment & Plan Note (Signed)
Fasting labs today for monitoring.

## 2022-09-03 NOTE — Assessment & Plan Note (Signed)
Chronic. No alarm sx present at this time. Will monitor labs today for management. Refills needed once lab results have returned. Continue current management and plan for f/u in 6 months or sooner if needed.

## 2022-09-03 NOTE — Assessment & Plan Note (Signed)
Labs today for monitoring.

## 2022-09-03 NOTE — Assessment & Plan Note (Signed)
Chronic. Previous evaluation with hematology. She has not signs of bleeding at this time and no bruising. Etiology unclear. Suspect that there may be an autoimmune component present given her history of hashimotos. Recommend monitoring closely for signs of bleeding and report immediately. Will monitor labs today.

## 2022-09-03 NOTE — Assessment & Plan Note (Signed)
Chronic. Well controlled with venlafaxine. No changes to plan of care at this time. Recommend continue with current treatment and if she would like to try to taper in the future to let me know so we can plan for a slow taper. No alarm sx present. F/U in 6 months or sooner if needed.

## 2022-09-03 NOTE — Assessment & Plan Note (Signed)
No new or worsening symptoms at this time. She has been diagnosed with bx from dermatology. Discussion that this is an immune response, but not an autoimmune condition. Recommend monitoring closely for new or worsening symptoms and reporting for treatment.

## 2022-09-03 NOTE — Assessment & Plan Note (Signed)
LE heaviness, weakness, and pain with no clear etiology. No vascular symptoms present at this time. Possibly related to vitamin deficiency, autoimmune component, or arthritic pain, although unlikely that this would cause the muscle pain. Will monitor labs today for evaluation and determine next best steps. I would like to help relieve this pain so that she is able to engage in increased physical activity for her overall health. Will make changes based on labs.

## 2022-09-04 LAB — CBC WITH DIFFERENTIAL/PLATELET
Basophils Absolute: 0 10*3/uL (ref 0.0–0.2)
Basos: 1 %
EOS (ABSOLUTE): 0.2 10*3/uL (ref 0.0–0.4)
Eos: 4 %
Hematocrit: 44.7 % (ref 34.0–46.6)
Hemoglobin: 14.9 g/dL (ref 11.1–15.9)
Immature Grans (Abs): 0 10*3/uL (ref 0.0–0.1)
Immature Granulocytes: 0 %
Lymphocytes Absolute: 1.3 10*3/uL (ref 0.7–3.1)
Lymphs: 25 %
MCH: 30 pg (ref 26.6–33.0)
MCHC: 33.3 g/dL (ref 31.5–35.7)
MCV: 90 fL (ref 79–97)
Monocytes Absolute: 0.4 10*3/uL (ref 0.1–0.9)
Monocytes: 8 %
Neutrophils Absolute: 3.3 10*3/uL (ref 1.4–7.0)
Neutrophils: 62 %
Platelets: 121 10*3/uL — ABNORMAL LOW (ref 150–450)
RBC: 4.97 x10E6/uL (ref 3.77–5.28)
RDW: 13.3 % (ref 11.7–15.4)
WBC: 5.3 10*3/uL (ref 3.4–10.8)

## 2022-09-04 LAB — COMPREHENSIVE METABOLIC PANEL
ALT: 30 IU/L (ref 0–32)
AST: 43 IU/L — ABNORMAL HIGH (ref 0–40)
Albumin/Globulin Ratio: 1.4 (ref 1.2–2.2)
Albumin: 4.2 g/dL (ref 3.9–4.9)
Alkaline Phosphatase: 95 IU/L (ref 44–121)
BUN/Creatinine Ratio: 28 (ref 12–28)
BUN: 16 mg/dL (ref 8–27)
Bilirubin Total: 1.2 mg/dL (ref 0.0–1.2)
CO2: 24 mmol/L (ref 20–29)
Calcium: 10 mg/dL (ref 8.7–10.3)
Chloride: 105 mmol/L (ref 96–106)
Creatinine, Ser: 0.58 mg/dL (ref 0.57–1.00)
Globulin, Total: 2.9 g/dL (ref 1.5–4.5)
Glucose: 83 mg/dL (ref 70–99)
Potassium: 4.2 mmol/L (ref 3.5–5.2)
Sodium: 141 mmol/L (ref 134–144)
Total Protein: 7.1 g/dL (ref 6.0–8.5)
eGFR: 101 mL/min/{1.73_m2} (ref >59)

## 2022-09-04 LAB — LIPID PANEL
Chol/HDL Ratio: 4.1 ratio (ref 0.0–4.4)
Cholesterol, Total: 203 mg/dL — ABNORMAL HIGH (ref 100–199)
HDL: 50 mg/dL (ref >39)
LDL Chol Calc (NIH): 130 mg/dL — ABNORMAL HIGH (ref 0–99)
Triglycerides: 126 mg/dL (ref 0–149)
VLDL Cholesterol Cal: 23 mg/dL (ref 5–40)

## 2022-09-04 LAB — VITAMIN D 25 HYDROXY (VIT D DEFICIENCY, FRACTURES): Vit D, 25-Hydroxy: 34.7 ng/mL (ref 30.0–100.0)

## 2022-09-04 LAB — HEMOGLOBIN A1C
Est. average glucose Bld gHb Est-mCnc: 111 mg/dL
Hgb A1c MFr Bld: 5.5 % (ref 4.8–5.6)

## 2022-09-04 LAB — T4, FREE: Free T4: 1.08 ng/dL (ref 0.82–1.77)

## 2022-09-04 LAB — TSH: TSH: 3.9 u[IU]/mL (ref 0.450–4.500)

## 2022-09-04 LAB — B12 AND FOLATE PANEL
Folate: 7.2 ng/mL (ref >3.0)
Vitamin B-12: 436 pg/mL (ref 232–1245)

## 2022-09-04 LAB — IRON,TIBC AND FERRITIN PANEL
Ferritin: 136 ng/mL (ref 15–150)
Iron Saturation: 43 % (ref 15–55)
Iron: 143 ug/dL — ABNORMAL HIGH (ref 27–139)
Total Iron Binding Capacity: 335 ug/dL (ref 250–450)
UIBC: 192 ug/dL (ref 118–369)

## 2022-09-10 ENCOUNTER — Ambulatory Visit
Admission: RE | Admit: 2022-09-10 | Discharge: 2022-09-10 | Disposition: A | Discharge status: Home / Self Care | Payer: BC Managed Care – PPO | Source: Ambulatory Visit | Attending: Physician Assistant | Admitting: Physician Assistant

## 2022-09-10 DIAGNOSIS — Z1231 Encounter for screening mammogram for malignant neoplasm of breast: Secondary | ICD-10-CM | POA: Diagnosis not present

## 2022-09-13 ENCOUNTER — Telehealth: Payer: BC Managed Care – PPO | Admitting: Physician Assistant

## 2022-09-13 DIAGNOSIS — R3989 Other symptoms and signs involving the genitourinary system: Secondary | ICD-10-CM

## 2022-09-13 MED ORDER — CEPHALEXIN 500 MG PO CAPS: 500.0000 mg | ORAL_CAPSULE | Freq: Two times a day (BID) | ORAL | 0 refills | Status: DC

## 2022-09-13 NOTE — Addendum Note (Signed)
Addended by: Hitesh Fouche, Clarise Cruz E on: 09/13/2022 08:10 PM   Modules accepted: Orders

## 2022-09-13 NOTE — Progress Notes (Signed)
E-Visit for Urinary Problems  We are sorry that you are not feeling well.  Here is how we plan to help!  Based on what you shared with me it looks like you most likely have a simple urinary tract infection.  A UTI (Urinary Tract Infection) is a bacterial infection of the bladder.  Most cases of urinary tract infections are simple to treat but a key part of your care is to encourage you to drink plenty of fluids and watch your symptoms carefully.  I have prescribed Keflex 500 mg twice a day for 7 days.  Your symptoms should gradually improve. Call us if the burning in your urine worsens, you develop worsening fever, back pain or pelvic pain or if your symptoms do not resolve after completing the antibiotic.  Urinary tract infections can be prevented by drinking plenty of water to keep your body hydrated.  Also be sure when you wipe, wipe from front to back and don't hold it in!  If possible, empty your bladder every 4 hours.  HOME CARE Drink plenty of fluids Compete the full course of the antibiotics even if the symptoms resolve Remember, when you need to go.go. Holding in your urine can increase the likelihood of getting a UTI! GET HELP RIGHT AWAY IF: You cannot urinate You get a high fever Worsening back pain occurs You see blood in your urine You feel sick to your stomach or throw up You feel like you are going to pass out  MAKE SURE YOU  Understand these instructions. Will watch your condition. Will get help right away if you are not doing well or get worse.   Thank you for choosing an e-visit.  Your e-visit answers were reviewed by a board certified advanced clinical practitioner to complete your personal care plan. Depending upon the condition, your plan could have included both over the counter or prescription medications.  Please review your pharmacy choice. Make sure the pharmacy is open so you can pick up prescription now. If there is a problem, you may contact your  provider through [employer] and have the prescription routed to another pharmacy.  Your safety is important to [place]. If you have drug allergies check your prescription carefully.   For the next 24 hours you can use MyChart to ask questions about today's visit, request a non-urgent call back, or ask for a work or school excuse. You will get an email in the next two days asking about your experience. I hope that your e-visit has been valuable and will speed your recovery.  I have spent 5 minutes in review of e-visit questionnaire, review and updating patient chart, medical decision making and response to patient.   Mar Daring, PA-C

## 2022-09-19 ENCOUNTER — Other Ambulatory Visit: Payer: Self-pay

## 2022-09-19 ENCOUNTER — Telehealth: Payer: Self-pay | Admitting: Nurse Practitioner

## 2022-09-19 DIAGNOSIS — E038 Other specified hypothyroidism: Secondary | ICD-10-CM

## 2022-09-19 MED ORDER — LEVOTHYROXINE SODIUM 50 MCG PO TABS: 50.0000 ug | ORAL_TABLET | Freq: Every day | ORAL | 1 refills | Status: DC

## 2022-09-19 NOTE — Telephone Encounter (Signed)
Pt called in and asks if her levothyroxine can be refilled she states she is almost out [facility].

## 2022-10-07 IMAGING — MG DIGITAL SCREENING BILAT W/ CAD
7 series · 7 of 7 positions shown · non-contrast
Comparison: Previous exam(s).

CLINICAL DATA: Screening.

EXAM:
DIGITAL SCREENING BILATERAL MAMMOGRAM WITH CAD
TECHNIQUE: Bilateral screening digital craniocaudal and mediolateral oblique
mammograms were obtained. The images were evaluated with
computer-aided detection.

[R CC (1 of 2)]
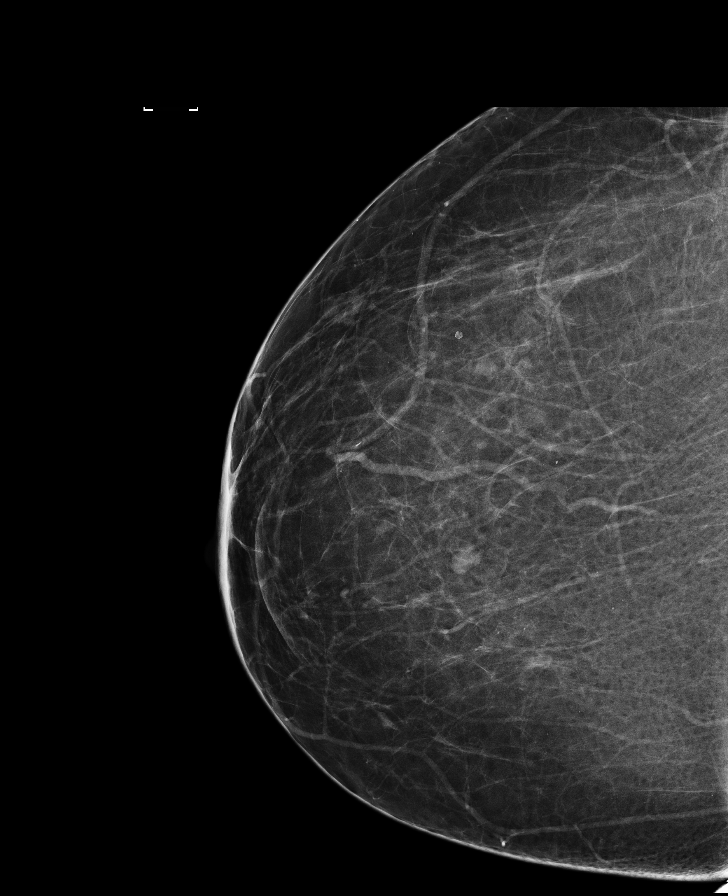

[L CC (1 of 2)]
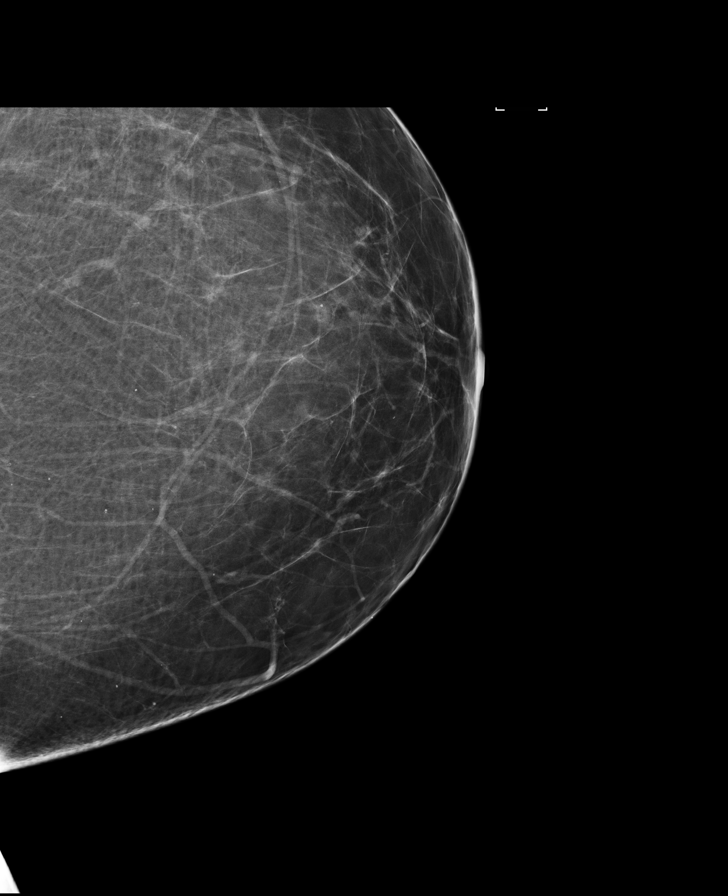

[R CC (2 of 2)]
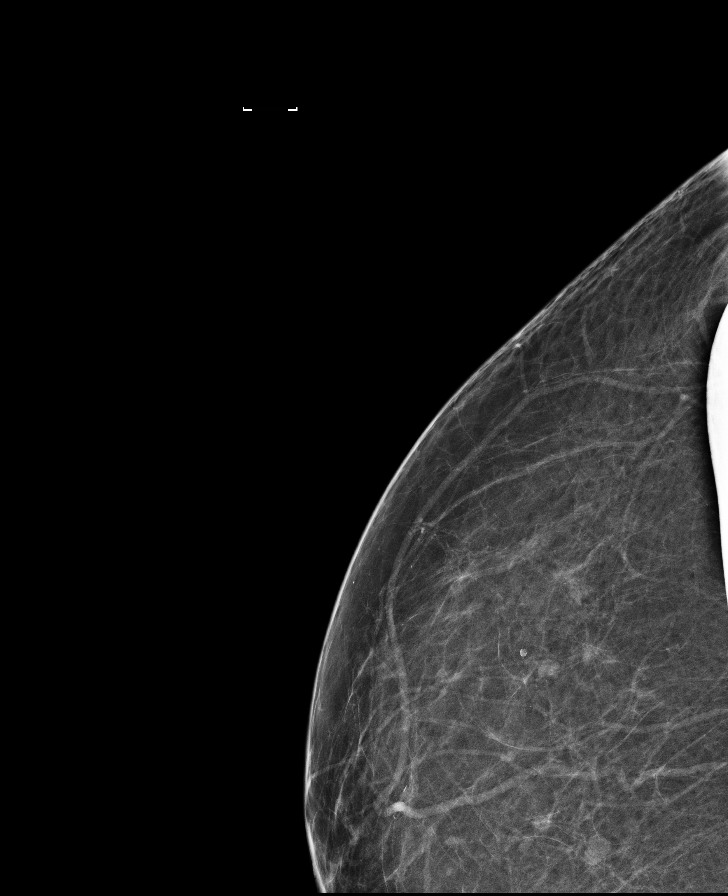

[L CC (2 of 2)]
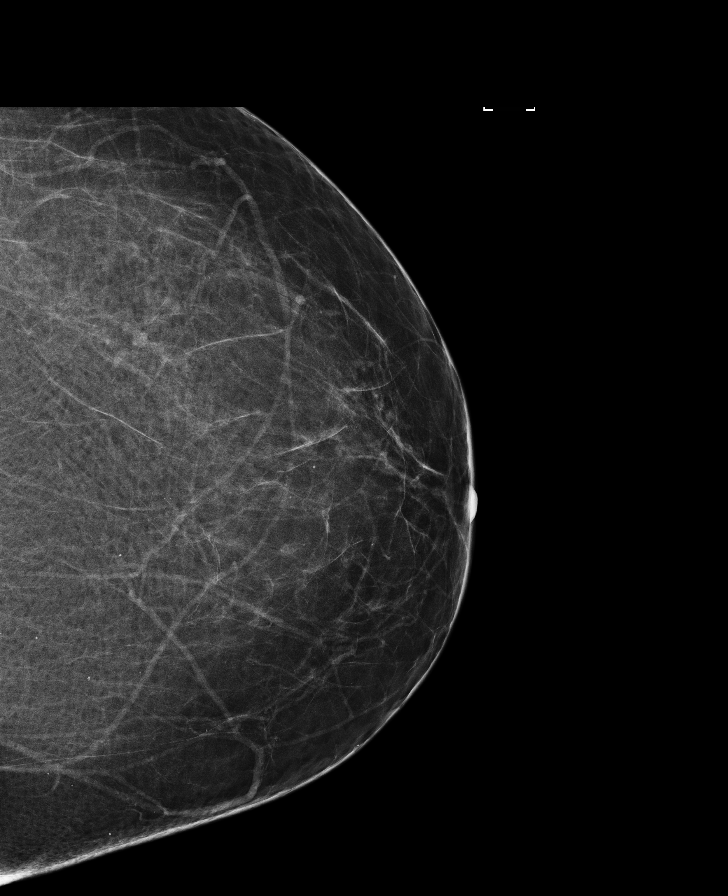

[L MLO (1 of 2)]
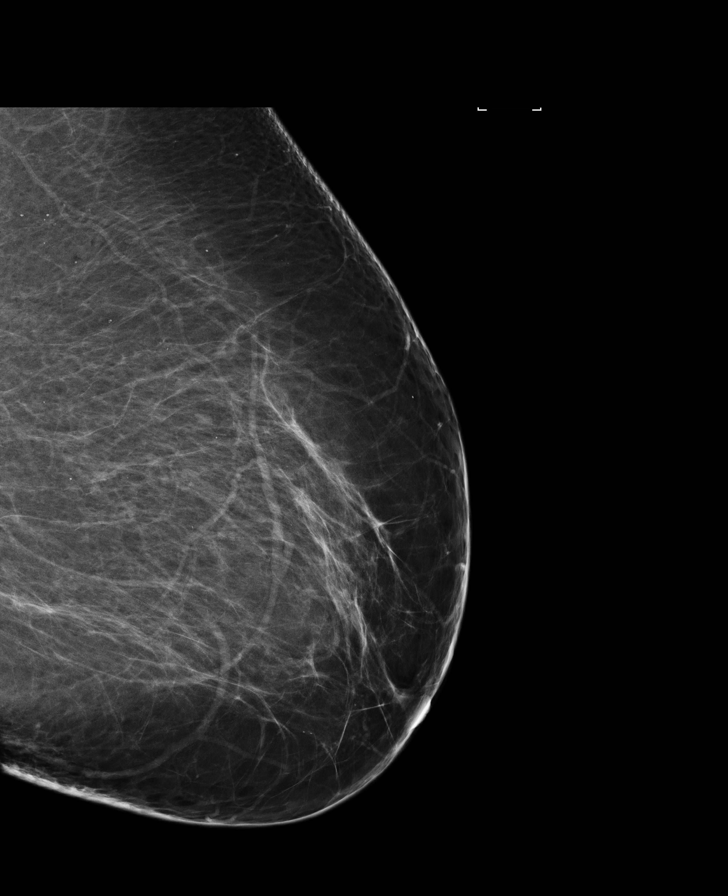

[R MLO]
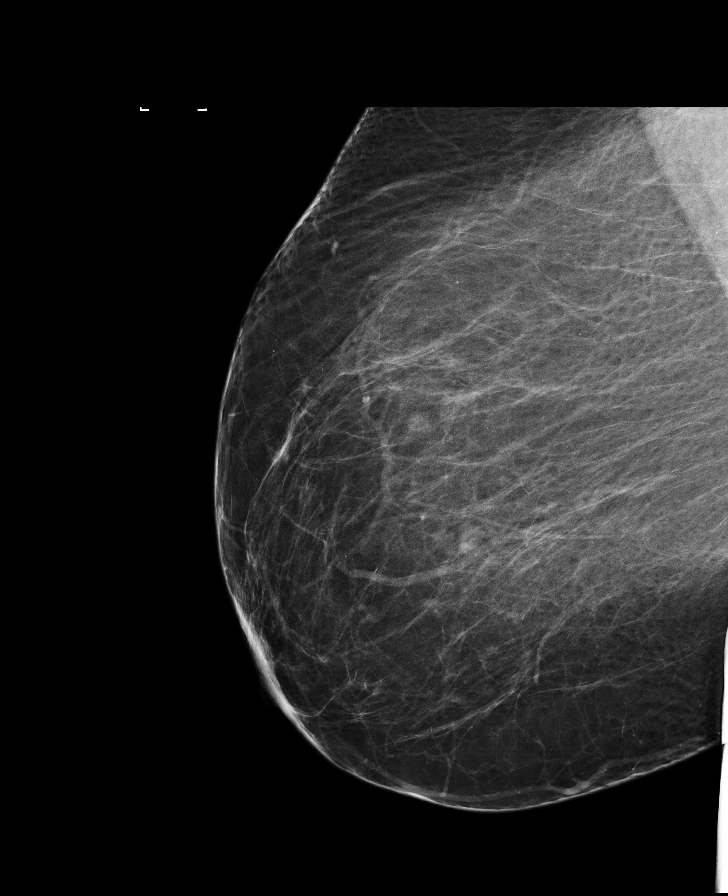

[L MLO (2 of 2)]
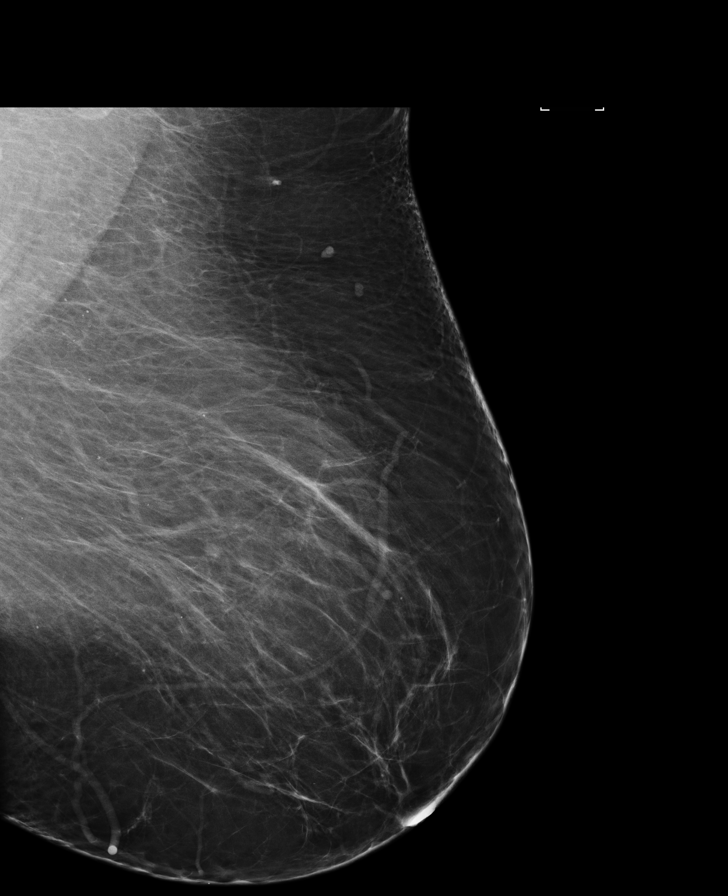

[7 of 7 positions shown; findings below may reference images not displayed]

ACR Breast Density Category b: There are scattered areas of
fibroglandular density.
FINDINGS: There are no findings suspicious for malignancy.
IMPRESSION: No mammographic evidence of malignancy. A result letter of this
screening mammogram will be mailed directly to the patient.

RECOMMENDATION:
Screening mammogram in one year. (Code:[id number]-V-ZRK)

BI-RADS CATEGORY  1: Negative.

## 2022-11-18 ENCOUNTER — Other Ambulatory Visit: Payer: BC Managed Care – PPO

## 2022-12-23 ENCOUNTER — Telehealth: Payer: Self-pay | Admitting: Nurse Practitioner

## 2022-12-23 NOTE — Telephone Encounter (Signed)
Pt is coming in for labs Thursday. Those orders are in system. Pt would like to add CBC due to another issue she has and those value haven't been taken in a while. Sending to Quita Skye and Clarise Cruz to make sure Clarise Cruz sees due to pt coming in Thursday.

## 2022-12-24 ENCOUNTER — Other Ambulatory Visit: Payer: Self-pay

## 2022-12-24 DIAGNOSIS — D696 Thrombocytopenia, unspecified: Secondary | ICD-10-CM

## 2022-12-26 ENCOUNTER — Other Ambulatory Visit: Payer: BC Managed Care – PPO

## 2023-02-26 ENCOUNTER — Other Ambulatory Visit: Payer: BC Managed Care – PPO

## 2023-02-26 DIAGNOSIS — E038 Other specified hypothyroidism: Secondary | ICD-10-CM

## 2023-02-26 DIAGNOSIS — D696 Thrombocytopenia, unspecified: Secondary | ICD-10-CM

## 2023-02-27 LAB — CBC WITH DIFFERENTIAL/PLATELET
Basophils Absolute: 0.1 10*3/uL (ref 0.0–0.2)
Basos: 1 %
EOS (ABSOLUTE): 0.2 10*3/uL (ref 0.0–0.4)
Eos: 3 %
Hematocrit: 44.8 % (ref 34.0–46.6)
Hemoglobin: 14.4 g/dL (ref 11.1–15.9)
Immature Grans (Abs): 0 10*3/uL (ref 0.0–0.1)
Immature Granulocytes: 0 %
Lymphocytes Absolute: 1.6 10*3/uL (ref 0.7–3.1)
Lymphs: 22 %
MCH: 28.9 pg (ref 26.6–33.0)
MCHC: 32.1 g/dL (ref 31.5–35.7)
MCV: 90 fL (ref 79–97)
Monocytes Absolute: 0.6 10*3/uL (ref 0.1–0.9)
Monocytes: 8 %
Neutrophils Absolute: 4.8 10*3/uL (ref 1.4–7.0)
Neutrophils: 66 %
Platelets: 120 10*3/uL — ABNORMAL LOW (ref 150–450)
RBC: 4.98 x10E6/uL (ref 3.77–5.28)
RDW: 13.4 % (ref 11.7–15.4)
WBC: 7.2 10*3/uL (ref 3.4–10.8)

## 2023-02-27 LAB — TSH: TSH: 4.06 u[IU]/mL (ref 0.450–4.500)

## 2023-02-28 ENCOUNTER — Other Ambulatory Visit: Payer: Self-pay

## 2023-02-28 ENCOUNTER — Telehealth: Payer: Self-pay | Admitting: Nurse Practitioner

## 2023-02-28 DIAGNOSIS — E038 Other specified hypothyroidism: Secondary | ICD-10-CM

## 2023-02-28 MED ORDER — LEVOTHYROXINE SODIUM 50 MCG PO TABS: 50.0000 ug | ORAL_TABLET | Freq: Every day | ORAL | 1 refills | Status: DC

## 2023-02-28 NOTE — Telephone Encounter (Signed)
Pt needs refill on levothyroxine   already had labs   [facility]

## 2023-04-28 ENCOUNTER — Telehealth: Payer: Self-pay | Admitting: Nurse Practitioner

## 2023-04-28 NOTE — Telephone Encounter (Signed)
Pt is aware Huntley Dec is not in the office, this is not urgent. She is asking if she could get a cardiac test done. She says that her husband goes here as well and he is getting one done so she feels it would be necessary for her to also get but wants Sara's opinion.

## 2023-05-13 NOTE — Telephone Encounter (Signed)
Please contact Soliyana to determine what cardiac test she is referring to.   Cardiac CT: Cash pay $99 for the CT scan to look for evidence of plaque buildup in the arteries of the heart.  I can order this without any visit needed.  I do recommend this for any patient with high cholesterol, particularly if not on medication for management. This helps give [place] an idea if there are increased risks.   Labs: Typically we monitor lipids (cholesterol) and kidney function to monitor.  There is a specific lipid test that breaks down the cholesterol sizes to help determine increased risks. I will be happy to place this order for her and we can set her up for the lab.  Other tests would like require a cardiology evaluation. If she would like something else done, we can set up an appointment to get more information for the referral, if she would like.

## 2023-06-30 ENCOUNTER — Other Ambulatory Visit: Payer: Self-pay | Admitting: Nurse Practitioner

## 2023-06-30 DIAGNOSIS — Z1231 Encounter for screening mammogram for malignant neoplasm of breast: Secondary | ICD-10-CM

## 2023-08-04 ENCOUNTER — Other Ambulatory Visit: Payer: Self-pay

## 2023-08-04 ENCOUNTER — Telehealth: Payer: Self-pay

## 2023-08-04 DIAGNOSIS — E063 Autoimmune thyroiditis: Secondary | ICD-10-CM

## 2023-08-04 MED ORDER — LEVOTHYROXINE SODIUM 50 MCG PO TABS: 50.0000 ug | ORAL_TABLET | Freq: Every day | ORAL | 0 refills | Status: DC

## 2023-08-04 NOTE — Telephone Encounter (Signed)
Pt needs a refill on levothyroxine. Scheduled a med check for 09/16/23.   States she needs a medication for sinus. OTC is not working. Experiencing nonstop drainage (sometimes with cough), ear aches, headaches, fever, and sometimes sore throat. Can not wait until appt. Please advise.

## 2023-08-06 ENCOUNTER — Telehealth: Payer: BC Managed Care – PPO | Admitting: Medical

## 2023-08-06 VITALS — Wt 280.0 lb

## 2023-08-06 DIAGNOSIS — R059 Cough, unspecified: Secondary | ICD-10-CM

## 2023-08-06 DIAGNOSIS — J3489 Other specified disorders of nose and nasal sinuses: Secondary | ICD-10-CM | POA: Diagnosis not present

## 2023-08-06 MED ORDER — BENZONATATE 200 MG PO CAPS: 200.0000 mg | ORAL_CAPSULE | Freq: Three times a day (TID) | ORAL | 0 refills | Status: DC | PRN

## 2023-08-06 MED ORDER — CEFUROXIME AXETIL 500 MG PO TABS: 500.0000 mg | ORAL_TABLET | Freq: Two times a day (BID) | ORAL | 0 refills | Status: AC

## 2023-08-06 NOTE — Progress Notes (Signed)
Subjective:     Patient ID: Krista Kelley, female   DOB: 19-Feb-1956, 67 y.o.   MRN: [id number]  This visit type was conducted due to national recommendations for restrictions regarding the COVID-19 Pandemic (e.g. social distancing) in an effort to limit this patient's exposure and mitigate transmission in our community.  Due to their co-morbid illnesses, this patient is at least at moderate risk for complications without adequate follow up.  This format is felt to be most appropriate for this patient at this time.    Documentation for virtual audio and video telecommunications through [facility] encounter:  The patient was located at home. The provider was located in the office. The patient did consent to this visit and is aware of possible charges through their insurance for this visit.  The other persons participating in this telemedicine service were none. Time spent on call was 20 minutes and in review of previous records 20 minutes total.  This virtual service is not related to other E/M service within previous 7 days.   HPI Chief Complaint  Patient presents with   Sinus Problem    Needs med for sinus as OTC is not working. Sinus pressure across head, pressure in cheeks, ear pain, cough all night,. Been going on since August 10th   Virtual for sinuses.  Has a history of sinus infection.  Has had similar things going on vacation in August that come and go but never fully cleared up  Tried OTC remedies but not resolving.  Started over left orbit, head pressure, sinus pressure, and can't sleep either due to ear throbbing or tooth pain.    Takes zyrtec daily year round, has lichen planus, and is on allergy medication daily.  Cough all night, clearing throat all night.  Has used mucinex  No fever, no wheezing or shortness of breath.  No nausea vomiting diarrhea.  No other aggravating or relieving factors. No other complaint.   Past Medical History:  Diagnosis Date   Anxiety  09/11/2020   Breast mass 09/11/2020   Depression 12/19/2017   Dysuria 02/26/2019   Elevated blood-pressure reading without diagnosis of hypertension 02/26/2019   Elevated liver enzymes    Enlarged thyroid    Hypothyroidism due to Hashimoto's thyroiditis    Lichen planus    Major depression, single episode 11/15/2021   NASH (nonalcoholic steatohepatitis)    Urinary frequency 02/26/2019   Vitamin D deficiency 08/03/2018   Current Outpatient Medications on File Prior to Visit  Medication Sig Dispense Refill   Cetirizine HCl (ZYRTEC PO) Take by mouth.     levothyroxine (SYNTHROID) 50 MCG tablet Take 1 tablet (50 mcg total) by mouth daily. 90 tablet 0   Magnesium 200 MG TABS Take by mouth.     Milk Thistle 1000 MG CAPS Take 1 capsule by mouth daily.     Probiotic Product (PROBIOTIC DAILY PO) Take 1 capsule by mouth daily.     venlafaxine XR (EFFEXOR-XR) 75 MG 24 hr capsule Take 2 capsules (150 mg total) by mouth daily. 180 capsule 3   VITAMIN D PO Take 2,000 Int'l Units by mouth daily.     EPINEPHrine (EPIPEN 2-PAK) 0.3 mg/0.3 mL IJ SOAJ injection Inject 0.3 mg into the muscle as needed for anaphylaxis. 1 each 2   No current facility-administered medications on file prior to visit.     Review of Systems As in subjective    Objective:   Physical Exam Due to coronavirus pandemic stay at home measures, patient visit was  virtual and they were not examined in person.   Wt 280 lb (127 kg)   BMI 49.60 kg/m   Gen: wd, wn, nad Not ill appearing      Assessment:     Encounter Diagnoses  Name Primary?   Sinus pressure Yes   Cough, unspecified type        Plan:     We discussed symptoms and concerns.  Begin medication below along with nasal saline flush and some over-the-counter Mucinex for the next several days.  Get thorough water intake.  If not much improved within the next 7 to 10 days then come in for an in person evaluation.  Also if not improving consider dentist visit  to rule out other potential sources of the sinus symptoms  Tonantzin was seen today for sinus problem.  Diagnoses and all orders for this visit:  Sinus pressure  Cough, unspecified type  Other orders -     cefUROXime (CEFTIN) 500 MG tablet; Take 1 tablet (500 mg total) by mouth 2 (two) times daily with a meal for 10 days. -     benzonatate (TESSALON) 200 MG capsule; Take 1 capsule (200 mg total) by mouth 3 (three) times daily as needed for cough.    F/u in December with PCP for med check as planned, otherwise follow-up prn

## 2023-08-30 DIAGNOSIS — G501 Atypical facial pain: Secondary | ICD-10-CM | POA: Diagnosis not present

## 2023-08-30 DIAGNOSIS — R030 Elevated blood-pressure reading, without diagnosis of hypertension: Secondary | ICD-10-CM | POA: Diagnosis not present

## 2023-08-30 DIAGNOSIS — Z6841 Body Mass Index (BMI) 40.0 and over, adult: Secondary | ICD-10-CM | POA: Diagnosis not present

## 2023-09-15 ENCOUNTER — Ambulatory Visit
Admission: RE | Admit: 2023-09-15 | Discharge: 2023-09-15 | Disposition: A | Discharge status: Home / Self Care | Payer: BC Managed Care – PPO | Source: Ambulatory Visit | Attending: Nurse Practitioner | Admitting: Nurse Practitioner

## 2023-09-15 DIAGNOSIS — Z1231 Encounter for screening mammogram for malignant neoplasm of breast: Secondary | ICD-10-CM

## 2023-09-16 ENCOUNTER — Encounter: Payer: Self-pay | Admitting: Nurse Practitioner

## 2023-09-16 ENCOUNTER — Ambulatory Visit: Payer: BC Managed Care – PPO | Admitting: Nurse Practitioner

## 2023-09-16 VITALS — BP 128/82 | HR 107 | Wt 287.6 lb

## 2023-09-16 DIAGNOSIS — M25561 Pain in right knee: Secondary | ICD-10-CM

## 2023-09-16 DIAGNOSIS — Z23 Encounter for immunization: Secondary | ICD-10-CM

## 2023-09-16 DIAGNOSIS — N3281 Overactive bladder: Secondary | ICD-10-CM | POA: Insufficient documentation

## 2023-09-16 DIAGNOSIS — E7800 Pure hypercholesterolemia, unspecified: Secondary | ICD-10-CM

## 2023-09-16 DIAGNOSIS — E063 Autoimmune thyroiditis: Secondary | ICD-10-CM

## 2023-09-16 DIAGNOSIS — N3090 Cystitis, unspecified without hematuria: Secondary | ICD-10-CM | POA: Insufficient documentation

## 2023-09-16 DIAGNOSIS — E559 Vitamin D deficiency, unspecified: Secondary | ICD-10-CM | POA: Diagnosis not present

## 2023-09-16 DIAGNOSIS — M25562 Pain in left knee: Secondary | ICD-10-CM

## 2023-09-16 DIAGNOSIS — N3944 Nocturnal enuresis: Secondary | ICD-10-CM

## 2023-09-16 DIAGNOSIS — G8929 Other chronic pain: Secondary | ICD-10-CM

## 2023-09-16 DIAGNOSIS — E1169 Type 2 diabetes mellitus with other specified complication: Secondary | ICD-10-CM

## 2023-09-16 DIAGNOSIS — R351 Nocturia: Secondary | ICD-10-CM | POA: Diagnosis not present

## 2023-09-16 DIAGNOSIS — D696 Thrombocytopenia, unspecified: Secondary | ICD-10-CM

## 2023-09-16 DIAGNOSIS — F418 Other specified anxiety disorders: Secondary | ICD-10-CM

## 2023-09-16 DIAGNOSIS — K7581 Nonalcoholic steatohepatitis (NASH): Secondary | ICD-10-CM

## 2023-09-16 DIAGNOSIS — E785 Hyperlipidemia, unspecified: Secondary | ICD-10-CM

## 2023-09-16 HISTORY — DX: Cystitis, unspecified without hematuria: N30.90

## 2023-09-16 LAB — POCT URINALYSIS DIP (CLINITEK)
Bilirubin, UA: NEGATIVE
Blood, UA: NEGATIVE
Glucose, UA: 250 mg/dL — AB
Ketones, POC UA: NEGATIVE mg/dL
Nitrite, UA: NEGATIVE
POC PROTEIN,UA: NEGATIVE
Spec Grav, UA: 1.02 (ref 1.010–1.025)
Urobilinogen, UA: 0.2 U/dL
pH, UA: 6 (ref 5.0–8.0)

## 2023-09-16 MED ORDER — VENLAFAXINE HCL ER 75 MG PO CP24: 150.0000 mg | ORAL_CAPSULE | Freq: Every day | ORAL | 3 refills | Status: DC

## 2023-09-16 MED ORDER — NITROFURANTOIN MONOHYD MACRO 100 MG PO CAPS: 100.0000 mg | ORAL_CAPSULE | Freq: Two times a day (BID) | ORAL | 0 refills | Status: DC

## 2023-09-16 MED ORDER — OXYBUTYNIN CHLORIDE ER 5 MG PO TB24: 5.0000 mg | ORAL_TABLET | Freq: Every day | ORAL | 1 refills | Status: DC

## 2023-09-16 MED ORDER — LEVOTHYROXINE SODIUM 50 MCG PO TABS: 50.0000 ug | ORAL_TABLET | Freq: Every day | ORAL | 0 refills | Status: DC

## 2023-09-16 NOTE — Patient Instructions (Addendum)
We will check your thyroid and vitamin D levels and make sure that these are OK and not causing some of the fatigue.   I have sent in treatment for the UTI. If we need to change this based on the culture I will let you know.   I have sent in the oxybutynin to try at bedtime for the overactive bladder. If this is not helpful in about 2 weeks please let me know.

## 2023-09-17 LAB — CMP14+EGFR
ALT: 41 [IU]/L — ABNORMAL HIGH (ref 0–32)
AST: 52 [IU]/L — ABNORMAL HIGH (ref 0–40)
Albumin: 3.3 g/dL — ABNORMAL LOW (ref 3.9–4.9)
Alkaline Phosphatase: 121 [IU]/L (ref 44–121)
BUN/Creatinine Ratio: 15 (ref 12–28)
BUN: 8 mg/dL (ref 8–27)
Bilirubin Total: 1.7 mg/dL — ABNORMAL HIGH (ref 0.0–1.2)
CO2: 25 mmol/L (ref 20–29)
Calcium: 9 mg/dL (ref 8.7–10.3)
Chloride: 107 mmol/L — ABNORMAL HIGH (ref 96–106)
Creatinine, Ser: 0.53 mg/dL — ABNORMAL LOW (ref 0.57–1.00)
Globulin, Total: 2.9 g/dL (ref 1.5–4.5)
Glucose: 235 mg/dL — ABNORMAL HIGH (ref 70–99)
Potassium: 4.1 mmol/L (ref 3.5–5.2)
Sodium: 145 mmol/L — ABNORMAL HIGH (ref 134–144)
Total Protein: 6.2 g/dL (ref 6.0–8.5)
eGFR: 103 mL/min/{1.73_m2} (ref >59)

## 2023-09-17 LAB — CBC WITH DIFFERENTIAL/PLATELET
Basophils Absolute: 0 10*3/uL (ref 0.0–0.2)
Basos: 1 %
EOS (ABSOLUTE): 0.2 10*3/uL (ref 0.0–0.4)
Eos: 4 %
Hematocrit: 42.8 % (ref 34.0–46.6)
Hemoglobin: 14.1 g/dL (ref 11.1–15.9)
Immature Grans (Abs): 0 10*3/uL (ref 0.0–0.1)
Immature Granulocytes: 0 %
Lymphocytes Absolute: 1.1 10*3/uL (ref 0.7–3.1)
Lymphs: 25 %
MCH: 31.6 pg (ref 26.6–33.0)
MCHC: 32.9 g/dL (ref 31.5–35.7)
MCV: 96 fL (ref 79–97)
Monocytes Absolute: 0.4 10*3/uL (ref 0.1–0.9)
Monocytes: 9 %
Neutrophils Absolute: 2.8 10*3/uL (ref 1.4–7.0)
Neutrophils: 61 %
Platelets: 98 10*3/uL — CL (ref 150–450)
RBC: 4.46 x10E6/uL (ref 3.77–5.28)
RDW: 13.5 % (ref 11.7–15.4)
WBC: 4.5 10*3/uL (ref 3.4–10.8)

## 2023-09-17 LAB — LIPID PANEL
Chol/HDL Ratio: 4.7 {ratio} — ABNORMAL HIGH (ref 0.0–4.4)
Cholesterol, Total: 189 mg/dL (ref 100–199)
HDL: 40 mg/dL (ref >39)
LDL Chol Calc (NIH): 124 mg/dL — ABNORMAL HIGH (ref 0–99)
Triglycerides: 142 mg/dL (ref 0–149)
VLDL Cholesterol Cal: 25 mg/dL (ref 5–40)

## 2023-09-17 LAB — HEMOGLOBIN A1C
Est. average glucose Bld gHb Est-mCnc: 160 mg/dL
Hgb A1c MFr Bld: 7.2 % — ABNORMAL HIGH (ref 4.8–5.6)

## 2023-09-17 LAB — TSH: TSH: 2.05 u[IU]/mL (ref 0.450–4.500)

## 2023-09-17 LAB — VITAMIN D 25 HYDROXY (VIT D DEFICIENCY, FRACTURES): Vit D, 25-Hydroxy: 46.5 ng/mL (ref 30.0–100.0)

## 2023-09-19 LAB — URINE CULTURE

## 2023-09-19 NOTE — Assessment & Plan Note (Signed)
Chronic. DIet and exercise recommended for optimal control and reduction of risk factors.

## 2023-09-19 NOTE — Progress Notes (Signed)
Krista Clamp, DNP, AGNP-c [facility]  [address] [place], [place] [address] [phone]  ESTABLISHED PATIENT- Chronic Health and/or Follow-Up Visit  Blood pressure 128/82, pulse (!) 107, weight 287 lb 9.6 oz (130.5 kg).    Krista Kelley is a 67 y.o. year old female presenting today for evaluation and management of chronic conditions.   .The patient, a 67 year old with a history of hypothyroidism and depression, presents with complaints of frequent urination, particularly at night, with up to nine episodes per night. This has been a persistent issue, causing significant disruption to the patient's sleep. The patient also reports occasional tooth pain and facial soreness, which she suspects may be related to sinus issues.  In addition to these symptoms, the patient has been experiencing significant fatigue for approximately a year. The fatigue is so severe that the patient reports she could sleep for up to 20 hours a day. The patient also mentions a recent illness, described as "junk," which was severe enough to cause concern but has since resolved.  The patient has a history of knee pain, which has been ongoing for three years. She reports that a recent course of steroids provided significant relief, marking the first time in three years that she has been without knee pain.  The patient's medication regimen includes levothyroxine for hypothyroidism and venlafaxine for depression. She also takes a variety of over-the-counter supplements, including magnesium, milk thistle, and a probiotic. The patient is interested in exploring options for managing her overactive bladder and knee pain.  The patient's son has a history of cancer, which has caused significant stress and worry for the patient. The patient's son was diagnosed in 76 and has had multiple health issues since, including skin cancer and a shunt replacement. The patient's concern for her son's health may be  contributing to her own symptoms of fatigue and stress.  All ROS negative with exception of what is listed above.   PHYSICAL EXAM Physical Exam Vitals and nursing note reviewed.  Constitutional:      Appearance: Normal appearance.  HENT:     Head: Normocephalic.  Eyes:     Pupils: Pupils are equal, round, and reactive to light.  Neck:     Vascular: No carotid bruit.  Cardiovascular:     Rate and Rhythm: Normal rate and regular rhythm.     Pulses: Normal pulses.     Heart sounds: Normal heart sounds.  Pulmonary:     Effort: Pulmonary effort is normal.     Breath sounds: Normal breath sounds.  Abdominal:     General: Bowel sounds are normal.     Palpations: Abdomen is soft.     Tenderness: There is abdominal tenderness.  Musculoskeletal:        General: Normal range of motion.     Cervical back: Normal range of motion.  Skin:    General: Skin is warm.  Neurological:     General: No focal deficit present.     Mental Status: She is alert and oriented to person, place, and time.  Psychiatric:        Mood and Affect: Mood normal.      PLAN Problem List Items Addressed This Visit     Hypothyroidism due to Hashimoto's thyroiditis - Primary   Reports significant fatigue and sleepiness, potentially related to hypothyroidism. Currently on levothyroxine. Discussed the importance of maintaining thyroid hormone levels and benefits of continued levothyroxine therapy. Explained that side effects are rare when dosed appropriately. - Refill levothyroxine prescription -  Send prescription to mail order pharmacy       Relevant Medications   levothyroxine (SYNTHROID) 50 MCG tablet   Other Relevant Orders   CBC with Differential/Platelet (Completed)   CMP14+EGFR (Completed)   Hemoglobin A1c (Completed)   TSH (Completed)   NASH (nonalcoholic steatohepatitis)   Recommend weight management, low fat diet, and routine exercise to aid in reduction of risks associated with fatty liver.        Relevant Orders   Lipid panel (Completed)   Morbid obesity (HCC)   Chronic. DIet and exercise recommended for optimal control and reduction of risk factors.       Relevant Orders   CMP14+EGFR (Completed)   Hemoglobin A1c (Completed)   Lipid panel (Completed)   Elevated LDL cholesterol level   Monitoring of labs today. No alarm symptoms. Not currently on medication management.  -Continue with regular low fat, high fiber diet and daily exercise      Relevant Orders   Lipid panel (Completed)   Thrombocytopenia (HCC)   Repeat labs. No symptoms at this time      Relevant Orders   CBC with Differential/Platelet (Completed)   Anxiety with depression   Currently on venlafaxine (Effexor) for depression. No new symptoms reported. Discussed benefits of continued venlafaxine therapy for managing depression symptoms. If anxiety/depressive symptoms seem to worsen, please let me know immediately.  - Refill venlafaxine prescription - Send prescription to mail order pharmacy      Relevant Medications   venlafaxine XR (EFFEXOR-XR) 75 MG 24 hr capsule   Cystitis   Reports frequent nocturia (up to nine times a night) and occasional daytime urination. Symptoms and bacterial presence confirm UTI. Discussed risks of untreated UTI, including potential kidney infection, and benefits of antibiotic treatment. Explained that Macrobid is generally well-tolerated with a low risk of side effects. - Order urine culture - Prescribe Macrobid - Send prescription to local [facility]      Relevant Medications   nitrofurantoin, macrocrystal-monohydrate, (MACROBID) 100 MG capsule   Overactive bladder   Reports frequent nocturia and daytime urination. Symptoms suggestive of overactive bladder. Discussed potential benefits of medication to manage symptoms. Explained that Ditropan (oxybutynin) can help reduce symptoms with common side effects including dry mouth and constipation. - Prescribe Ditropan  (oxybutynin) - Send prescription to local [facility]      Frequent urination at night   Relevant Orders   POCT URINALYSIS DIP (CLINITEK) (Completed)   Urine Culture (Completed)   Vitamin D deficiency   Relevant Orders   Vitamin D, 25-hydroxy (Completed)   Other Visit Diagnoses       Need for Tdap vaccination       Relevant Orders   Tdap vaccine greater than or equal to 7yo IM (Completed)     Nocturnal enuresis       Relevant Medications   oxybutynin (DITROPAN XL) 5 MG 24 hr tablet     Chronic pain of both knees       Relevant Medications   venlafaxine XR (EFFEXOR-XR) 75 MG 24 hr capsule   Other Relevant Orders   Ambulatory referral to Orthopedic Surgery       Return in about 6 months (around 03/16/2024) for Med Management 30.  Krista Clamp, DNP, AGNP-c

## 2023-09-19 NOTE — Assessment & Plan Note (Signed)
Repeat labs. No symptoms at this time

## 2023-09-19 NOTE — Assessment & Plan Note (Signed)
Reports frequent nocturia and daytime urination. Symptoms suggestive of overactive bladder. Discussed potential benefits of medication to manage symptoms. Explained that Ditropan (oxybutynin) can help reduce symptoms with common side effects including dry mouth and constipation. - Prescribe Ditropan (oxybutynin) - Send prescription to local [facility]

## 2023-09-19 NOTE — Assessment & Plan Note (Signed)
Recommend weight management, low fat diet, and routine exercise to aid in reduction of risks associated with fatty liver.

## 2023-09-19 NOTE — Assessment & Plan Note (Signed)
Reports frequent nocturia (up to nine times a night) and occasional daytime urination. Symptoms and bacterial presence confirm UTI. Discussed risks of untreated UTI, including potential kidney infection, and benefits of antibiotic treatment. Explained that Macrobid is generally well-tolerated with a low risk of side effects. - Order urine culture - Prescribe Macrobid - Send prescription to local [facility]

## 2023-09-19 NOTE — Assessment & Plan Note (Signed)
Currently on venlafaxine (Effexor) for depression. No new symptoms reported. Discussed benefits of continued venlafaxine therapy for managing depression symptoms. If anxiety/depressive symptoms seem to worsen, please let me know immediately.  - Refill venlafaxine prescription - Send prescription to mail order pharmacy

## 2023-09-19 NOTE — Assessment & Plan Note (Signed)
Reports significant fatigue and sleepiness, potentially related to hypothyroidism. Currently on levothyroxine. Discussed the importance of maintaining thyroid hormone levels and benefits of continued levothyroxine therapy. Explained that side effects are rare when dosed appropriately. - Refill levothyroxine prescription - Send prescription to mail order pharmacy

## 2023-09-19 NOTE — Assessment & Plan Note (Signed)
Monitoring of labs today. No alarm symptoms. Not currently on medication management.  -Continue with regular low fat, high fiber diet and daily exercise

## 2023-09-23 DIAGNOSIS — E108 Type 1 diabetes mellitus with unspecified complications: Secondary | ICD-10-CM | POA: Insufficient documentation

## 2023-09-23 DIAGNOSIS — E1169 Type 2 diabetes mellitus with other specified complication: Secondary | ICD-10-CM | POA: Insufficient documentation

## 2023-10-15 ENCOUNTER — Ambulatory Visit: Payer: BC Managed Care – PPO | Admitting: Orthopaedic Surgery

## 2023-10-29 ENCOUNTER — Other Ambulatory Visit (INDEPENDENT_AMBULATORY_CARE_PROVIDER_SITE_OTHER): Payer: BC Managed Care – PPO

## 2023-10-29 ENCOUNTER — Ambulatory Visit: Payer: BC Managed Care – PPO | Admitting: Orthopaedic Surgery

## 2023-10-29 VITALS — Ht 63.0 in | Wt 289.8 lb

## 2023-10-29 DIAGNOSIS — M25562 Pain in left knee: Secondary | ICD-10-CM

## 2023-10-29 DIAGNOSIS — G8929 Other chronic pain: Secondary | ICD-10-CM

## 2023-10-29 DIAGNOSIS — M25561 Pain in right knee: Secondary | ICD-10-CM | POA: Diagnosis not present

## 2023-10-29 DIAGNOSIS — M1712 Unilateral primary osteoarthritis, left knee: Secondary | ICD-10-CM | POA: Diagnosis not present

## 2023-10-29 DIAGNOSIS — M170 Bilateral primary osteoarthritis of knee: Secondary | ICD-10-CM

## 2023-10-29 DIAGNOSIS — Z6841 Body Mass Index (BMI) 40.0 and over, adult: Secondary | ICD-10-CM

## 2023-10-29 DIAGNOSIS — M1711 Unilateral primary osteoarthritis, right knee: Secondary | ICD-10-CM | POA: Insufficient documentation

## 2023-10-29 MED ORDER — CELECOXIB 200 MG PO CAPS: 200.0000 mg | ORAL_CAPSULE | Freq: Two times a day (BID) | ORAL | 3 refills | Status: DC | PRN

## 2023-10-29 NOTE — Progress Notes (Signed)
The patient is someone we have seen in the remote past.  She is 68 years old and has been dealing with chronic bilateral knee pain for a long period of time.  She is a diabetic.  She had had a significant upper respiratory issue about a month or more ago and was on prednisone.  With her being diabetic this did increase her blood glucose in a month ago her hemoglobin A1c was 7.2 which is the highest that it has been.  She is also someone who is morbidly obese.  She has thrombocytopenia and Hashimoto's thyroiditis.  She has certainly comorbidities that are affecting her daily basis.  She is only 68 years old and certainly young.  Her knee pain is detrimentally affecting her mobility, her quality of life and her actives daily living.  Examination of both knees shows her knees are thin with not a large soft tissue envelope around her knees.  She has significant varus malalignment of both knees with medial joint line tenderness and patellofemoral crepitation.  Both knees feel ligaments are stable.    x-rays of both knees show severe end-stage arthritis that is bone-on-bone wear of both knees.  There is significant varus malalignment of both knees with bone-on-bone wear of the medial and patellofemoral joint.  Her BMI is listed at over 50 with today's weight and height calculation.  We talked in length in detail about her knees and her weight.  She is definitely in need of weight loss in order to qualify for knee replacement surgery.  She also needs to continue to work on better blood glucose control.  I have encouraged her to follow-up with her primary care physician about blood glucose control.  She certainly could qualify for weight loss medications.  We will also send her to a weight loss clinic for their evaluation and treatment of liver morbid obesity.  I will send in some Celebrex as anti-inflammatory I would like to see her back in 3 months for repeat weight and BMI calculation.

## 2023-10-30 ENCOUNTER — Other Ambulatory Visit: Payer: Self-pay

## 2023-11-03 ENCOUNTER — Ambulatory Visit: Payer: BC Managed Care – PPO | Admitting: Nurse Practitioner

## 2023-11-03 ENCOUNTER — Encounter: Payer: Self-pay | Admitting: Nurse Practitioner

## 2023-11-03 VITALS — BP 130/82 | HR 108 | Wt 291.0 lb

## 2023-11-03 DIAGNOSIS — E0849 Diabetes mellitus due to underlying condition with other diabetic neurological complication: Secondary | ICD-10-CM

## 2023-11-03 DIAGNOSIS — K7581 Nonalcoholic steatohepatitis (NASH): Secondary | ICD-10-CM | POA: Diagnosis not present

## 2023-11-03 DIAGNOSIS — M1712 Unilateral primary osteoarthritis, left knee: Secondary | ICD-10-CM

## 2023-11-03 DIAGNOSIS — E1169 Type 2 diabetes mellitus with other specified complication: Secondary | ICD-10-CM

## 2023-11-03 DIAGNOSIS — D696 Thrombocytopenia, unspecified: Secondary | ICD-10-CM

## 2023-11-03 DIAGNOSIS — R748 Abnormal levels of other serum enzymes: Secondary | ICD-10-CM

## 2023-11-03 DIAGNOSIS — E7800 Pure hypercholesterolemia, unspecified: Secondary | ICD-10-CM

## 2023-11-03 DIAGNOSIS — M1711 Unilateral primary osteoarthritis, right knee: Secondary | ICD-10-CM

## 2023-11-03 MED ORDER — ONDANSETRON 4 MG PO TBDP: 4.0000 mg | ORAL_TABLET | Freq: Three times a day (TID) | ORAL | 0 refills | Status: DC | PRN

## 2023-11-03 MED ORDER — MOUNJARO 2.5 MG/0.5ML ~~LOC~~ SOAJ: 2.5000 mg | SUBCUTANEOUS | 0 refills | Status: DC

## 2023-11-03 NOTE — Assessment & Plan Note (Signed)
Severe knee pain with some relief from Celebrex, improving steadiness and mobility. - Continue Celebrex as prescribed

## 2023-11-03 NOTE — Assessment & Plan Note (Signed)
Severe knee pain with some relief from Celebrex, improving steadiness and mobility. We are currently working on weight management to aid in additional reduction of symptoms and damage to allow for better mobility.  - Continue Celebrex as prescribed

## 2023-11-03 NOTE — Assessment & Plan Note (Signed)
Elevated glucose and HbA1c levels from December labs, possibly influenced by high-sugar intake, stress, and recent steroid use. No family history of diabetes or significant hyperglycemia symptoms aside from nocturia. Discussed stress and steroids' impact on glucose levels. Advised waiting until mid-March for a more accurate HbA1c assessment given that her steroids were taken in late November. Discussed continuous glucose monitoring with [employer] to give an idea of blood sugar readings while we wait. We also discussed potential use of Mounjaro for diabetes and weight loss, including benefits and side effects (nausea managed with Zofran). She is interested in trial of mounjaro, which I feel will certainly benefit her weight and blood sugar levels.  - Recheck HbA1c in mid-March - Initiate continuous glucose monitoring with [employer] - Prescribe Mounjaro for potential diabetes and weight loss - Prescribe Zofran for potential nausea from [employer]

## 2023-11-03 NOTE — Patient Instructions (Addendum)
We are going to see about getting coverage for the medication Mounjaro to help with your weight management and control of blood sugar. This is a once a week injection you give yourself in your abdomen.    WEIGHT LOSS PLANNING Your progress today shows:     11/03/2023    2:35 PM 10/29/2023    2:57 PM 09/16/2023   11:50 AM  Vitals with BMI  Height  5\' 3"    Weight 291 lbs 289 lbs 13 oz 287 lbs 10 oz  BMI 51.56 51.35   Systolic 130  128  Diastolic 82  82  Pulse 108  107    For best management of weight, it is vital to balance intake versus output. This means the number of calories burned per day must be less than the calories you take in with food and drink.   I recommend trying to follow a diet with the following: Calories: 1200-1500 calories per day Carbohydrates: 150-180 grams of carbohydrates per day  Why: Gives your body enough "quick fuel" for cells to maintain normal function without sending them into starvation mode.  Protein: At least 90 grams of protein per day- 30 grams with each meal Why: Protein takes longer and uses more energy than carbohydrates to break down for fuel. The carbohydrates in your meals serves as quick energy sources and proteins help use some of that extra quick energy to break down to produce long term energy. This helps you not feel hungry as quickly and protein breakdown burns calories.  Water: Drink AT LEAST 64 ounces of water per day  Why: Water is essential to healthy metabolism. Water helps to fill the stomach and keep you fuller longer. Water is required for healthy digestion and filtering of waste in the body.  Fat: Limit fats in your diet- when choosing fats, choose foods with lower fats content such as lean meats (chicken, fish, [place]).  Why: Increased fat intake leads to storage "for later". Once you burn your carbohydrate energy, your body goes into fat and protein breakdown mode to help you loose weight.  Cholesterol: Fats and oils that are LIQUID  at room temperature are best. Choose vegetable oils (olive oil, avocado oil, nuts). Avoid fats that are SOLID at room temperature (animal fats, processed meats). Healthy fats are often found in whole grains, beans, nuts, seeds, and berries.  Why: Elevated cholesterol levels lead to build up of cholesterol on the inside of your blood vessels. This will eventually cause the blood vessels to become hard and can lead to high blood pressure and damage to your organs. When the blood flow is reduced, but the pressure is high from cholesterol buildup, parts of the cholesterol can break off and form clots that can go to the brain or heart leading to a stroke or heart attack.  Fiber: Increase amount of SOLUBLE the fiber in your diet. This helps to fill you up, lowers cholesterol, and helps with digestion. Some foods high in soluble fiber are oats, peas, beans, apples, carrots, barley, and citrus fruits.   Why: Fiber fills you up, helps remove excess cholesterol, and aids in healthy digestion which are all very important in weight management.   I recommend the following as a minimum activity routine: Purposeful walk or other physical activity at least 20 minutes every single day. This means purposefully taking a walk, jog, bike, swim, treadmill, elliptical, dance, etc.  This activity should be ABOVE your normal daily activities, such as walking at work. Goal  exercise should be at least 150 minutes a week- work your way up to this.   Heart Rate: Your maximum exercise heart rate should be 220 - Your Age in Years. When exercising, get your heart rate up, but avoid going over the maximum targeted heart rate.  60-70% of your maximum heart rate is where you tend to burn the most fat. To find this number:  220 - Age In Years= Max HR  Max HR x 0.6 (or 0.7) = Fat Burning HR The Fat Burning HR is your goal heart rate while working out to burn the most fat.  NEVER exercise to the point your feel lightheaded, weak,  nauseated, dizzy. If you experience ANY of these symptoms- STOP exercise! Allow yourself to cool down and your heart rate to come down. Then restart slower next time.  If at ANY TIME you feel chest pain or chest pressure during exercise, STOP IMMEDIATELY and seek medical attention.

## 2023-11-03 NOTE — Assessment & Plan Note (Signed)
Low platelet counts potentially influenced by recent steroid use. No significant bleeding symptoms. Explained steroids' impact on platelet levels. - Monitor platelet counts

## 2023-11-03 NOTE — Progress Notes (Signed)
Krista Clamp, DNP, AGNP-c [facility]  [address] [place], [place] [address] [phone]  ESTABLISHED PATIENT- Chronic Health and/or Follow-Up Visit  Blood pressure 130/82, pulse (!) 108, weight 291 lb (132 kg).    Krista Kelley is a 68 y.o. year old female presenting today for evaluation and management of chronic conditions.   The patient, with a history of thrombocytopenia and knee pain, presents for a follow-up visit after recent lab results indicated elevated A1c and glucose levels. The patient expressed concerns about the accuracy of these results, citing recent stressors, steroid use, and dietary intake as potential confounding factors.  The patient reported a recent period of high stress due to family health issues and job insecurity, which she believes may have contributed to elevated cortisol levels and subsequently, higher blood sugar levels. She also noted recent steroid use, specifically a steroid shot received on November 23rd and a subsequent oral steroid regimen, which she completed two weeks prior to the blood draw. The patient also acknowledged consuming high-sugar foods, including Pop-Tarts and sweet juice, prior to the blood draw.  Despite the elevated A1c and glucose levels, the patient reported feeling generally well and not experiencing typical symptoms of high blood sugar, such as increased thirst or frequent urination. She did note, however, that she has always been a "thirstier person" and has been taking medication to manage frequent nighttime urination.  In addition to these concerns, the patient reported chronic knee pain, which has limited her mobility and ability to exercise. She recently started taking Celebrex, which she believes has helped to manage the pain. Despite these challenges, the patient expressed a desire to lose weight and showed interest in potential weight loss medications which were recommended by the orthopedist for reduction  of further damage to the knees.  The patient's thrombocytopenia was also discussed, with the patient noting a recent drop in platelet count. However, she did not report any symptoms related to this condition.  All ROS negative with exception of what is listed above.   PHYSICAL EXAM Physical Exam Vitals and nursing note reviewed.  Constitutional:      General: She is not in acute distress.    Appearance: Normal appearance. She is obese. She is not ill-appearing.  Eyes:     Conjunctiva/sclera: Conjunctivae normal.  Skin:    General: Skin is warm and dry.  Neurological:     Mental Status: She is alert and oriented to person, place, and time.  Psychiatric:        Mood and Affect: Mood normal.      PLAN Problem List Items Addressed This Visit     Thrombocytopenia (HCC)   Low platelet counts potentially influenced by recent steroid use. No significant bleeding symptoms. Explained steroids' impact on platelet levels. - Monitor platelet counts      Relevant Medications   tirzepatide (MOUNJARO) 2.5 MG/0.5ML Pen   ondansetron (ZOFRAN-ODT) 4 MG disintegrating tablet   Type 2 diabetes mellitus with other specified complication (HCC) - Primary   Elevated glucose and HbA1c levels from December labs, possibly influenced by high-sugar intake, stress, and recent steroid use. No family history of diabetes or significant hyperglycemia symptoms aside from nocturia. Discussed stress and steroids' impact on glucose levels. Advised waiting until mid-March for a more accurate HbA1c assessment given that her steroids were taken in late November. Discussed continuous glucose monitoring with Freestyle Libre to give an idea of blood sugar readings while we wait. We also discussed potential use of Mounjaro for diabetes  and weight loss, including benefits and side effects (nausea managed with Zofran). She is interested in trial of mounjaro, which I feel will certainly benefit her weight and blood sugar levels.   - Recheck HbA1c in mid-March - Initiate continuous glucose monitoring with Freestyle Libre - Prescribe Mounjaro for potential diabetes and weight loss - Prescribe Zofran for potential nausea from [employer]      Relevant Medications   tirzepatide (MOUNJARO) 2.5 MG/0.5ML Pen   ondansetron (ZOFRAN-ODT) 4 MG disintegrating tablet   Unilateral primary osteoarthritis, right knee   Severe knee pain with some relief from Celebrex, improving steadiness and mobility. We are currently working on weight management to aid in additional reduction of symptoms and damage to allow for better mobility.  - Continue Celebrex as prescribed      Unilateral primary osteoarthritis, left knee   Severe knee pain with some relief from Celebrex, improving steadiness and mobility. - Continue Celebrex as prescribed      NASH (nonalcoholic steatohepatitis)   Relevant Medications   tirzepatide (MOUNJARO) 2.5 MG/0.5ML Pen   ondansetron (ZOFRAN-ODT) 4 MG disintegrating tablet   Morbid obesity (HCC)   Relevant Medications   tirzepatide (MOUNJARO) 2.5 MG/0.5ML Pen   ondansetron (ZOFRAN-ODT) 4 MG disintegrating tablet   Elevated LDL cholesterol level   Relevant Medications   tirzepatide (MOUNJARO) 2.5 MG/0.5ML Pen   ondansetron (ZOFRAN-ODT) 4 MG disintegrating tablet   Elevated liver enzymes   Relevant Medications   tirzepatide (MOUNJARO) 2.5 MG/0.5ML Pen   ondansetron (ZOFRAN-ODT) 4 MG disintegrating tablet   Other Visit Diagnoses       Diabetes due to undrl condition w oth diabetic neuro comp (HCC)   (Chronic)     Relevant Medications   tirzepatide (MOUNJARO) 2.5 MG/0.5ML Pen       Return for Med Management 30.  Krista Carmyn Hamm, DNP, AGNP-c Time: 45 minutes, >50% spent counseling, care coordination, chart review, and documentation.

## 2023-11-06 ENCOUNTER — Telehealth: Payer: Self-pay | Admitting: Nurse Practitioner

## 2023-11-06 ENCOUNTER — Other Ambulatory Visit (HOSPITAL_COMMUNITY): Payer: Self-pay

## 2023-11-06 ENCOUNTER — Telehealth: Payer: Self-pay

## 2023-11-06 NOTE — Telephone Encounter (Signed)
Pharmacy Patient Advocate Encounter   Received notification from CoverMyMeds that prior authorization for [facility] is required/requested.   Insurance verification completed.   The patient is insured through [employer] .   Per test claim: PA required; PA submitted to above mentioned insurance via CoverMyMeds Key/confirmation #/EOC (Key: [id number])   Status is pending

## 2023-11-07 ENCOUNTER — Other Ambulatory Visit (HOSPITAL_COMMUNITY): Payer: Self-pay

## 2023-11-07 NOTE — Telephone Encounter (Signed)
 Krista Kelley

## 2023-11-07 NOTE — Telephone Encounter (Signed)
Pharmacy Patient Advocate Encounter  Received notification from [employer] that Prior Authorization for [facility] 2.5mg  has been APPROVED from 12.31.24 to 1.30.26. Ran test claim, Copay is $RTS AND WAS LAST FILLED 1.31.25. This test claim was processed through [facility]- copay amounts may vary at other pharmacies due to pharmacy/plan contracts, or as the patient moves through the different stages of their insurance plan.   PA #/Case ID/Reference #: (Key: [id number])

## 2023-11-10 ENCOUNTER — Telehealth: Payer: Self-pay | Admitting: Nurse Practitioner

## 2023-11-10 NOTE — Telephone Encounter (Signed)
Pt called and states her freestyle libre quit working, it wont do anything and she is wondering if she still needs it and should get a new one?

## 2023-11-11 ENCOUNTER — Other Ambulatory Visit (HOSPITAL_COMMUNITY): Payer: Self-pay

## 2023-11-11 ENCOUNTER — Other Ambulatory Visit: Payer: Self-pay

## 2023-11-11 ENCOUNTER — Telehealth: Payer: Self-pay

## 2023-11-11 MED ORDER — FREESTYLE LIBRE 3 PLUS SENSOR MISC: 2 refills | Status: DC

## 2023-11-11 NOTE — Telephone Encounter (Signed)
 Pharmacy Patient Advocate Encounter   Received notification from CoverMyMeds that prior authorization forFreeStyle Libre 3 Plus Sensor  is required/requested.   Insurance verification completed.   The patient is insured through [employer] .   Per test claim: PA required; PA submitted to above mentioned insurance via CoverMyMeds Key/confirmation #/EOC (Key: [id number])  Status is pending

## 2023-11-12 ENCOUNTER — Other Ambulatory Visit: Payer: Self-pay

## 2023-11-12 MED ORDER — LANCETS MISC. MISC: 1.0000 | Freq: Three times a day (TID) | 1 refills | Status: AC

## 2023-11-12 MED ORDER — LANCET DEVICE MISC: 1.0000 | Freq: Three times a day (TID) | 0 refills | Status: AC

## 2023-11-12 MED ORDER — BLOOD GLUCOSE MONITORING SUPPL DEVI: 1.0000 | Freq: Three times a day (TID) | 0 refills | Status: DC

## 2023-11-12 MED ORDER — BLOOD GLUCOSE TEST VI STRP: 1.0000 | ORAL_STRIP | Freq: Three times a day (TID) | 1 refills | Status: AC

## 2023-11-17 ENCOUNTER — Telehealth: Payer: Self-pay

## 2023-11-17 ENCOUNTER — Other Ambulatory Visit (HOSPITAL_COMMUNITY): Payer: Self-pay

## 2023-11-17 NOTE — Telephone Encounter (Signed)
 I have done a follow up call to the pts plan and was informed that a decision will be made by 11/26/23.

## 2023-11-17 NOTE — Telephone Encounter (Signed)
 I have submitted A Prior Authorization to the pts Insurance on 2/4 with no response as of yet  on 2/10. On the submitted Prior Authorization it states a turn-around time is 24-72hrs. Which that time has surpassed. I have done a follow up call to the pts plan and was informed after on hold that a decision will be made by 11/26/23  Contact:[phone] WUJ:[id number]

## 2023-11-18 ENCOUNTER — Other Ambulatory Visit: Payer: Self-pay

## 2023-11-18 MED ORDER — FREESTYLE LIBRE 3 PLUS SENSOR MISC: 2 refills | Status: DC

## 2023-11-21 NOTE — Telephone Encounter (Signed)
Pharmacy Patient Advocate Encounter  Received notification from [employer] that Prior Authorization forFreeStyle Libre 3 Plus Sensor has been DENIED.  Full denial letter will be uploaded to the media tab. See denial reason below.

## 2023-11-24 ENCOUNTER — Telehealth: Payer: Self-pay | Admitting: Nurse Practitioner

## 2023-11-24 ENCOUNTER — Encounter: Payer: Self-pay | Admitting: Nurse Practitioner

## 2023-11-24 NOTE — Telephone Encounter (Signed)
Pt requesting for a reader for her freestyle libre to be sent to   [facility] [phone] - [place], [place] - [address] AT [facility] OF [address]

## 2023-11-25 ENCOUNTER — Other Ambulatory Visit: Payer: Self-pay

## 2023-11-25 MED ORDER — FREESTYLE LIBRE 3 PLUS SENSOR MISC: 2 refills | Status: DC

## 2023-11-25 MED ORDER — FREESTYLE LIBRE 3 READER DEVI: 1.0000 | Freq: Once | 0 refills | Status: AC

## 2023-12-10 ENCOUNTER — Encounter (INDEPENDENT_AMBULATORY_CARE_PROVIDER_SITE_OTHER): Payer: Self-pay

## 2023-12-22 ENCOUNTER — Other Ambulatory Visit: Payer: Self-pay | Admitting: Nurse Practitioner

## 2023-12-22 DIAGNOSIS — E7800 Pure hypercholesterolemia, unspecified: Secondary | ICD-10-CM

## 2023-12-22 DIAGNOSIS — K7581 Nonalcoholic steatohepatitis (NASH): Secondary | ICD-10-CM

## 2023-12-22 DIAGNOSIS — D696 Thrombocytopenia, unspecified: Secondary | ICD-10-CM

## 2023-12-22 DIAGNOSIS — E1169 Type 2 diabetes mellitus with other specified complication: Secondary | ICD-10-CM

## 2023-12-22 DIAGNOSIS — R748 Abnormal levels of other serum enzymes: Secondary | ICD-10-CM

## 2024-01-24 ENCOUNTER — Encounter: Payer: Self-pay | Admitting: Nurse Practitioner

## 2024-01-28 ENCOUNTER — Ambulatory Visit: Payer: BC Managed Care – PPO | Admitting: Orthopaedic Surgery

## 2024-02-15 ENCOUNTER — Other Ambulatory Visit: Payer: Self-pay | Admitting: Nurse Practitioner

## 2024-02-15 DIAGNOSIS — D696 Thrombocytopenia, unspecified: Secondary | ICD-10-CM

## 2024-02-15 DIAGNOSIS — E7800 Pure hypercholesterolemia, unspecified: Secondary | ICD-10-CM

## 2024-02-15 DIAGNOSIS — K7581 Nonalcoholic steatohepatitis (NASH): Secondary | ICD-10-CM

## 2024-02-15 DIAGNOSIS — R748 Abnormal levels of other serum enzymes: Secondary | ICD-10-CM

## 2024-02-15 DIAGNOSIS — E1169 Type 2 diabetes mellitus with other specified complication: Secondary | ICD-10-CM

## 2024-03-16 ENCOUNTER — Encounter: Payer: Self-pay | Admitting: Nurse Practitioner

## 2024-03-16 ENCOUNTER — Ambulatory Visit: Payer: BC Managed Care – PPO | Admitting: Nurse Practitioner

## 2024-03-16 VITALS — BP 126/80 | HR 88 | Ht 63.0 in | Wt 291.6 lb

## 2024-03-16 DIAGNOSIS — E7800 Pure hypercholesterolemia, unspecified: Secondary | ICD-10-CM

## 2024-03-16 DIAGNOSIS — K7581 Nonalcoholic steatohepatitis (NASH): Secondary | ICD-10-CM | POA: Diagnosis not present

## 2024-03-16 DIAGNOSIS — R3915 Urgency of urination: Secondary | ICD-10-CM | POA: Diagnosis not present

## 2024-03-16 DIAGNOSIS — D696 Thrombocytopenia, unspecified: Secondary | ICD-10-CM

## 2024-03-16 DIAGNOSIS — E1169 Type 2 diabetes mellitus with other specified complication: Secondary | ICD-10-CM | POA: Diagnosis not present

## 2024-03-16 DIAGNOSIS — E162 Hypoglycemia, unspecified: Secondary | ICD-10-CM

## 2024-03-16 DIAGNOSIS — N3944 Nocturnal enuresis: Secondary | ICD-10-CM

## 2024-03-16 DIAGNOSIS — E063 Autoimmune thyroiditis: Secondary | ICD-10-CM

## 2024-03-16 DIAGNOSIS — R748 Abnormal levels of other serum enzymes: Secondary | ICD-10-CM

## 2024-03-16 DIAGNOSIS — N3281 Overactive bladder: Secondary | ICD-10-CM

## 2024-03-16 LAB — LP+LDL DIRECT

## 2024-03-16 MED ORDER — FREESTYLE LIBRE 3 PLUS SENSOR MISC: 2 refills | Status: DC

## 2024-03-16 MED ORDER — TIRZEPATIDE 10 MG/0.5ML ~~LOC~~ SOAJ: 10.0000 mg | SUBCUTANEOUS | 0 refills | Status: DC

## 2024-03-16 MED ORDER — ONDANSETRON 4 MG PO TBDP: 4.0000 mg | ORAL_TABLET | Freq: Three times a day (TID) | ORAL | 1 refills | Status: DC | PRN

## 2024-03-16 MED ORDER — TIRZEPATIDE 5 MG/0.5ML ~~LOC~~ SOAJ: 5.0000 mg | SUBCUTANEOUS | 0 refills | Status: DC

## 2024-03-16 MED ORDER — LEVOTHYROXINE SODIUM 50 MCG PO TABS: 50.0000 ug | ORAL_TABLET | Freq: Every day | ORAL | 0 refills | Status: DC

## 2024-03-16 MED ORDER — OXYBUTYNIN CHLORIDE ER 10 MG PO TB24: 10.0000 mg | ORAL_TABLET | Freq: Every day | ORAL | 1 refills | Status: DC

## 2024-03-16 MED ORDER — TIRZEPATIDE 7.5 MG/0.5ML ~~LOC~~ SOAJ: 7.5000 mg | SUBCUTANEOUS | 0 refills | Status: DC

## 2024-03-16 NOTE — Patient Instructions (Signed)

## 2024-03-16 NOTE — Progress Notes (Signed)
 Dell Fennel, DNP, AGNP-c [facility]  [address] [place], [place] [address] [phone]  ESTABLISHED PATIENT- Chronic Health and/or Follow-Up Visit  Blood pressure 126/80, pulse 88, height 5' 3 (1.6 m), weight 291 lb 9.6 oz (132.3 kg).    History of Present Illness Krista Kelley is a 68 year old female with diabetes who presents with difficulty managing her weight and blood sugar levels. She is accompanied by her husband.  She has experienced fluctuations in her weight, initially losing 20 pounds but then regaining it. She attributes this to dietary habits, including the reintroduction of soda and uncertainty about what foods to eat. She loves beans and has been consuming them frequently. She also has a tendency to eat sweets and struggles with portion control.  She is confused about what foods to eat, asking for clarification about carbohydrates and what foods to avoid. She has been using a continuous glucose monitor, which shows fluctuations in her blood sugar levels, particularly after meals high in carbohydrates. Her blood sugar levels are mostly within the target range but occasionally spike.  She reports frequent nighttime urination, occurring four to six times per night, which disrupts her sleep. She has been taking Ditropan  (oxybutynin ) 5 mg, which initially helped but is no longer effective. She also experiences fatigue and low energy levels, which she attributes to poor sleep.  She is currently taking Mounjaro  for her diabetes management and has been using Zofran  for nausea, which she finds helpful. She also takes Synthroid  for thyroid  management. Her blood sugar control impacts her nausea levels.  Her husband assists her with administering her diabetes medication and supports her dietary changes. She has been married for 48 years.   All ROS negative with exception of what is listed above.   PHYSICAL EXAM Physical Exam Vitals and nursing note reviewed.   Constitutional:      General: She is not in acute distress.    Appearance: Normal appearance. She is obese.  HENT:     Head: Normocephalic.   Eyes:     Pupils: Pupils are equal, round, and reactive to light.    Cardiovascular:     Rate and Rhythm: Normal rate and regular rhythm.     Pulses: Normal pulses.     Heart sounds: Normal heart sounds.  Pulmonary:     Effort: Pulmonary effort is normal.     Breath sounds: Normal breath sounds.   Musculoskeletal:        General: Normal range of motion.     Cervical back: Normal range of motion.   Skin:    General: Skin is warm.     Capillary Refill: Capillary refill takes less than 2 seconds.   Neurological:     General: No focal deficit present.     Mental Status: She is alert and oriented to person, place, and time.   Psychiatric:        Mood and Affect: Mood normal.      PLAN Problem List Items Addressed This Visit     Hypothyroidism due to Hashimoto's thyroiditis   Refill on levothyroxine  today. No alarm symptoms present. Will make changes as necessary based on labs.       Relevant Medications   levothyroxine  (SYNTHROID ) 50 MCG tablet   NASH (nonalcoholic steatohepatitis)   Recommend weight management, low fat diet, and routine exercise to aid in reduction of risks associated with fatty liver.       Relevant Medications   tirzepatide  (MOUNJARO ) 5 MG/0.5ML Pen  tirzepatide  (MOUNJARO ) 7.5 MG/0.5ML Pen   ondansetron  (ZOFRAN -ODT) 4 MG disintegrating tablet   Other Relevant Orders   Hemoglobin A1c (Completed)   CBC with Differential/Platelet (Completed)   Comprehensive metabolic panel with GFR (Completed)   LP+LDL Direct (Completed)   Elevated liver enzymes   Most likely associated with fatty liver disease. Will monitor labs today.       Relevant Medications   ondansetron  (ZOFRAN -ODT) 4 MG disintegrating tablet   Morbid obesity (HCC)   Initial weight loss of 20lbs on Mounjaro  for diabetes, but unfortunately  started to drink soda again and weight has returned to baseline. She is not getting any exercise due to knee pain. I recommend working to reduce caloric intake and carbohydrates to aid in weight management. I recommend activities in the pool or seated exercises to eliminate the concern of knee pain but still allow for burning of calories on a daily basis. Will increase Mounjaro  today.       Relevant Medications   tirzepatide  (MOUNJARO ) 5 MG/0.5ML Pen   tirzepatide  (MOUNJARO ) 7.5 MG/0.5ML Pen   tirzepatide  (MOUNJARO ) 10 MG/0.5ML Pen   ondansetron  (ZOFRAN -ODT) 4 MG disintegrating tablet   Other Relevant Orders   Hemoglobin A1c (Completed)   CBC with Differential/Platelet (Completed)   Comprehensive metabolic panel with GFR (Completed)   LP+LDL Direct (Completed)   Elevated LDL cholesterol level   High risk for cardiovascular disease in the setting of obesity, fatty liver, and diabetes. Not currently on statin therapy. Will monitor labs to determine need.       Relevant Medications   tirzepatide  (MOUNJARO ) 5 MG/0.5ML Pen   tirzepatide  (MOUNJARO ) 7.5 MG/0.5ML Pen   ondansetron  (ZOFRAN -ODT) 4 MG disintegrating tablet   Other Relevant Orders   Hemoglobin A1c (Completed)   CBC with Differential/Platelet (Completed)   Comprehensive metabolic panel with GFR (Completed)   LP+LDL Direct (Completed)   Thrombocytopenia (HCC)   Repeat labs for monitoring.       Relevant Medications   ondansetron  (ZOFRAN -ODT) 4 MG disintegrating tablet   Overactive bladder - Primary   She reports frequent urination, particularly nocturia, which disrupts her sleep. She is currently taking Ditropan  (oxybutynin ), which initially helped but is now less effective. It is suspected that blood glucose levels may be contributing to the urinary frequency, as it improved when her blood glucose was better controlled. Increasing the Ditropan  dosage and checking a urine sample were discussed as next steps. - Increase Ditropan   (oxybutynin ) dosage - Check urine sample to assess for other potential causes      Relevant Medications   tirzepatide  (MOUNJARO ) 5 MG/0.5ML Pen   tirzepatide  (MOUNJARO ) 7.5 MG/0.5ML Pen   Other Relevant Orders   Hemoglobin A1c (Completed)   CBC with Differential/Platelet (Completed)   Comprehensive metabolic panel with GFR (Completed)   LP+LDL Direct (Completed)   Urine Culture (Completed)   Type 2 diabetes mellitus with other specified complication (HCC)   She is experiencing challenges in managing her Type 2 Diabetes Mellitus, particularly with dietary habits and weight management. She initially lost 20 pounds but regained the weight after resuming soda consumption and not adhering to dietary recommendations. She is experiencing fluctuations in blood glucose levels. The pathophysiology of diabetes and the importance of balancing carbohydrate and protein intake to manage blood glucose levels effectively were explained. She is currently on Mounjaro , which helps block the enzyme that causes fat storage, but excessive carbohydrate intake can override its effects. Emphasis was placed on the importance of moderation in carbohydrate consumption and the  inclusion of protein in meals to stabilize blood glucose levels. She was advised that focusing on health improvements rather than just weight loss is crucial. - Increase Mounjaro  dosage - Provide handouts on protein and carbohydrate intake - Provide two-week meal plans with examples of balanced meals - Refill Mounjaro  and Zofran  prescriptions at [facility]      Relevant Medications   tirzepatide  (MOUNJARO ) 5 MG/0.5ML Pen   tirzepatide  (MOUNJARO ) 7.5 MG/0.5ML Pen   tirzepatide  (MOUNJARO ) 10 MG/0.5ML Pen   ondansetron  (ZOFRAN -ODT) 4 MG disintegrating tablet   Continuous Glucose Sensor (FREESTYLE LIBRE 3 PLUS SENSOR) MISC   Other Relevant Orders   Hemoglobin A1c (Completed)   CBC with Differential/Platelet (Completed)   Comprehensive metabolic  panel with GFR (Completed)   LP+LDL Direct (Completed)   Hypoglycemia   Intermittent episodes of hypoglycemia noted, not on insulin therapy. Suspect high carbohydrate intake without protein may be a contributor. Many of the alerts coming at night. Recommend continuous glucose monitor to aid in management. Dietary information provided on low carb, high protein diet options to help with understanding of what foods are appropriate.       Relevant Medications   levothyroxine  (SYNTHROID ) 50 MCG tablet   Urinary urgency   Relevant Orders   POCT URINALYSIS DIP (CLINITEK) (Completed)   POCT UA - Microalbumin (Completed)   Urine Culture (Completed)   Other Visit Diagnoses       Nocturnal enuresis       Relevant Medications   oxybutynin  (DITROPAN  XL) 10 MG 24 hr tablet       Return in about 4 months (around 07/16/2024) for Med Management 30.  Dell Fennel, DNP, AGNP-c Time: , >50% spent counseling, care coordination, chart review, and documentation.

## 2024-03-17 ENCOUNTER — Ambulatory Visit: Payer: Self-pay | Admitting: Nurse Practitioner

## 2024-03-17 DIAGNOSIS — N3090 Cystitis, unspecified without hematuria: Secondary | ICD-10-CM

## 2024-03-17 LAB — LP+LDL DIRECT
Cholesterol, Total: 142 mg/dL (ref 100–199)
HDL: 35 mg/dL — ABNORMAL LOW (ref >39)
LDL Chol Calc (NIH): 89 mg/dL (ref 0–99)
LDL Direct: 90 mg/dL (ref 0–99)
Triglycerides: 93 mg/dL (ref 0–149)
VLDL Cholesterol Cal: 18 mg/dL (ref 5–40)

## 2024-03-17 LAB — COMPREHENSIVE METABOLIC PANEL WITH GFR
ALT: 19 IU/L (ref 0–32)
AST: 33 IU/L (ref 0–40)
Albumin: 3 g/dL — ABNORMAL LOW (ref 3.9–4.9)
Alkaline Phosphatase: 140 IU/L — ABNORMAL HIGH (ref 44–121)
BUN/Creatinine Ratio: 22 (ref 12–28)
BUN: 11 mg/dL (ref 8–27)
Bilirubin Total: 2 mg/dL — ABNORMAL HIGH (ref 0.0–1.2)
CO2: 23 mmol/L (ref 20–29)
Calcium: 9.4 mg/dL (ref 8.7–10.3)
Chloride: 105 mmol/L (ref 96–106)
Creatinine, Ser: 0.5 mg/dL — ABNORMAL LOW (ref 0.57–1.00)
Globulin, Total: 2.7 g/dL (ref 1.5–4.5)
Glucose: 77 mg/dL (ref 70–99)
Potassium: 3.7 mmol/L (ref 3.5–5.2)
Sodium: 143 mmol/L (ref 134–144)
Total Protein: 5.7 g/dL — ABNORMAL LOW (ref 6.0–8.5)
eGFR: 104 mL/min/{1.73_m2} (ref >59)

## 2024-03-17 LAB — CBC WITH DIFFERENTIAL/PLATELET
Basophils Absolute: 0 10*3/uL (ref 0.0–0.2)
Basos: 1 %
EOS (ABSOLUTE): 0.1 10*3/uL (ref 0.0–0.4)
Eos: 2 %
Hematocrit: 37 % (ref 34.0–46.6)
Hemoglobin: 12.4 g/dL (ref 11.1–15.9)
Immature Grans (Abs): 0 10*3/uL (ref 0.0–0.1)
Immature Granulocytes: 0 %
Lymphocytes Absolute: 0.9 10*3/uL (ref 0.7–3.1)
Lymphs: 13 %
MCH: 31.6 pg (ref 26.6–33.0)
MCHC: 33.5 g/dL (ref 31.5–35.7)
MCV: 94 fL (ref 79–97)
Monocytes Absolute: 0.7 10*3/uL (ref 0.1–0.9)
Monocytes: 11 %
Neutrophils Absolute: 4.8 10*3/uL (ref 1.4–7.0)
Neutrophils: 73 %
Platelets: 109 10*3/uL — ABNORMAL LOW (ref 150–450)
RBC: 3.92 x10E6/uL (ref 3.77–5.28)
RDW: 13.8 % (ref 11.7–15.4)
WBC: 6.7 10*3/uL (ref 3.4–10.8)

## 2024-03-17 LAB — HEMOGLOBIN A1C
Est. average glucose Bld gHb Est-mCnc: 97 mg/dL
Hgb A1c MFr Bld: 5 % (ref 4.8–5.6)

## 2024-03-19 ENCOUNTER — Telehealth: Payer: Self-pay

## 2024-03-19 NOTE — Telephone Encounter (Signed)
 Called pt. Back to let her know her urine was not ran the other day. I got her scheduled on Monday to re collect urine and do UA and micro.   Copied from CRM [id number]. Topic: Clinical - Lab/Test Results >> Mar 19, 2024  9:29 AM Donald Frost wrote: Reason for CRM: The patient called in to obtain her lab results concerning her urinalysis. I told her it has not been released yet and that I would let her provider know she would like a call to go over results as soon as possible. She is concerned she may have a Uti and if so needs medicine as soon as possible. Please assist patient further

## 2024-03-22 ENCOUNTER — Other Ambulatory Visit

## 2024-03-22 LAB — POCT URINALYSIS DIP (CLINITEK)
Blood, UA: NEGATIVE
Glucose, UA: NEGATIVE mg/dL
Nitrite, UA: NEGATIVE
Spec Grav, UA: 1.01 (ref 1.010–1.025)
Urobilinogen, UA: 4 U/dL — AB
pH, UA: 7 (ref 5.0–8.0)

## 2024-03-22 LAB — POCT UA - MICROALBUMIN
Albumin/Creatinine Ratio, Urine, POC: 14.2
Creatinine, POC: 160.2 mg/dL
Microalbumin Ur, POC: 22.7 mg/L

## 2024-03-22 MED ORDER — CIPROFLOXACIN HCL 500 MG PO TABS: 500.0000 mg | ORAL_TABLET | Freq: Two times a day (BID) | ORAL | 0 refills | Status: DC

## 2024-03-22 MED ORDER — FLUCONAZOLE 150 MG PO TABS: ORAL_TABLET | ORAL | 2 refills | Status: DC

## 2024-03-24 LAB — URINE CULTURE

## 2024-03-24 NOTE — Assessment & Plan Note (Signed)
 She is experiencing challenges in managing her Type 2 Diabetes Mellitus, particularly with dietary habits and weight management. She initially lost 20 pounds but regained the weight after resuming soda consumption and not adhering to dietary recommendations. She is experiencing fluctuations in blood glucose levels. The pathophysiology of diabetes and the importance of balancing carbohydrate and protein intake to manage blood glucose levels effectively were explained. She is currently on Mounjaro , which helps block the enzyme that causes fat storage, but excessive carbohydrate intake can override its effects. Emphasis was placed on the importance of moderation in carbohydrate consumption and the inclusion of protein in meals to stabilize blood glucose levels. She was advised that focusing on health improvements rather than just weight loss is crucial. - Increase Mounjaro  dosage - Provide handouts on protein and carbohydrate intake - Provide two-week meal plans with examples of balanced meals - Refill Mounjaro  and Zofran  prescriptions at [facility]

## 2024-03-24 NOTE — Assessment & Plan Note (Signed)
Repeat labs for monitoring.

## 2024-03-24 NOTE — Assessment & Plan Note (Signed)
 Intermittent episodes of hypoglycemia noted, not on insulin therapy. Suspect high carbohydrate intake without protein may be a contributor. Many of the alerts coming at night. Recommend continuous glucose monitor to aid in management. Dietary information provided on low carb, high protein diet options to help with understanding of what foods are appropriate.

## 2024-03-24 NOTE — Assessment & Plan Note (Signed)
 Initial weight loss of 20lbs on Mounjaro  for diabetes, but unfortunately started to drink soda again and weight has returned to baseline. She is not getting any exercise due to knee pain. I recommend working to reduce caloric intake and carbohydrates to aid in weight management. I recommend activities in the pool or seated exercises to eliminate the concern of knee pain but still allow for burning of calories on a daily basis. Will increase Mounjaro  today.

## 2024-03-24 NOTE — Assessment & Plan Note (Signed)
 Recommend weight management, low fat diet, and routine exercise to aid in reduction of risks associated with fatty liver.

## 2024-03-24 NOTE — Assessment & Plan Note (Signed)
 Refill on levothyroxine  today. No alarm symptoms present. Will make changes as necessary based on labs.

## 2024-03-24 NOTE — Assessment & Plan Note (Signed)
 High risk for cardiovascular disease in the setting of obesity, fatty liver, and diabetes. Not currently on statin therapy. Will monitor labs to determine need.

## 2024-03-24 NOTE — Assessment & Plan Note (Signed)
 She reports frequent urination, particularly nocturia, which disrupts her sleep. She is currently taking Ditropan  (oxybutynin ), which initially helped but is now less effective. It is suspected that blood glucose levels may be contributing to the urinary frequency, as it improved when her blood glucose was better controlled. Increasing the Ditropan  dosage and checking a urine sample were discussed as next steps. - Increase Ditropan  (oxybutynin ) dosage - Check urine sample to assess for other potential causes

## 2024-03-24 NOTE — Assessment & Plan Note (Signed)
 Most likely associated with fatty liver disease. Will monitor labs today.

## 2024-03-25 MED ORDER — AMPICILLIN 500 MG PO CAPS: 500.0000 mg | ORAL_CAPSULE | Freq: Three times a day (TID) | ORAL | 0 refills | Status: DC

## 2024-03-27 ENCOUNTER — Emergency Department (HOSPITAL_COMMUNITY)
Admission: EM | Admit: 2024-03-27 | Discharge: 2024-04-06 | Disposition: E | Attending: Emergency Medicine | Admitting: Emergency Medicine

## 2024-03-27 DIAGNOSIS — J9620 Acute and chronic respiratory failure, unspecified whether with hypoxia or hypercapnia: Secondary | ICD-10-CM | POA: Diagnosis not present

## 2024-03-27 DIAGNOSIS — R55 Syncope and collapse: Secondary | ICD-10-CM | POA: Diagnosis not present

## 2024-03-27 DIAGNOSIS — E039 Hypothyroidism, unspecified: Secondary | ICD-10-CM | POA: Insufficient documentation

## 2024-03-27 DIAGNOSIS — R404 Transient alteration of awareness: Secondary | ICD-10-CM | POA: Diagnosis not present

## 2024-03-27 DIAGNOSIS — J449 Chronic obstructive pulmonary disease, unspecified: Secondary | ICD-10-CM | POA: Diagnosis not present

## 2024-03-27 DIAGNOSIS — R069 Unspecified abnormalities of breathing: Secondary | ICD-10-CM | POA: Diagnosis not present

## 2024-03-27 DIAGNOSIS — I469 Cardiac arrest, cause unspecified: Secondary | ICD-10-CM | POA: Diagnosis not present

## 2024-03-27 DIAGNOSIS — I509 Heart failure, unspecified: Secondary | ICD-10-CM | POA: Insufficient documentation

## 2024-03-27 LAB — I-STAT CHEM 8, ED
BUN: 27 mg/dL — ABNORMAL HIGH (ref 8–23)
Calcium, Ion: 1.2 mmol/L (ref 1.15–1.40)
Chloride: 107 mmol/L (ref 98–111)
Creatinine, Ser: 0.9 mg/dL (ref 0.44–1.00)
Glucose, Bld: 150 mg/dL — ABNORMAL HIGH (ref 70–99)
HCT: 42 % (ref 36.0–46.0)
Hemoglobin: 14.3 g/dL (ref 12.0–15.0)
Potassium: 4.5 mmol/L (ref 3.5–5.1)
Sodium: 143 mmol/L (ref 135–145)
TCO2: 20 mmol/L — ABNORMAL LOW (ref 22–32)

## 2024-03-27 MED ORDER — TENECTEPLASE 50 MG IV KIT
PACK | INTRAVENOUS | Status: AC
  Administered 2024-03-27: 50 mg via INTRAVENOUS
  Filled 2024-03-27: qty 10

## 2024-03-27 MED ORDER — TENECTEPLASE 50 MG IV KIT: 50.0000 mg | PACK | Freq: Once | INTRAVENOUS | Status: AC

## 2024-03-27 MED ORDER — CALCIUM CHLORIDE 10 % IV SOLN
INTRAVENOUS | Status: DC | PRN
  Administered 2024-03-27: 1 g via INTRAVENOUS

## 2024-03-27 MED ORDER — SODIUM BICARBONATE 8.4 % IV SOLN
INTRAVENOUS | Status: DC | PRN
  Administered 2024-03-27: 50 meq via INTRAVENOUS

## 2024-03-27 MED ORDER — NOREPINEPHRINE 4 MG/250ML-% IV SOLN
INTRAVENOUS | Status: AC
  Filled 2024-03-27: qty 250

## 2024-03-27 MED ORDER — EPINEPHRINE 1 MG/10ML IJ SOSY
PREFILLED_SYRINGE | INTRAMUSCULAR | Status: DC | PRN
  Administered 2024-03-27 (×7): 1 mg via INTRAVENOUS

## 2024-03-27 MED ORDER — EPINEPHRINE HCL 5 MG/250ML IV SOLN IN NS
INTRAVENOUS | Status: AC
  Filled 2024-03-27: qty 250

## 2024-04-06 NOTE — ED Notes (Signed)
 [facility] contacted, spoke with Evalene Oto, referral number: [phone], pt suitable for tissue and eye donation

## 2024-04-06 NOTE — ED Provider Notes (Signed)
 [place] EMERGENCY DEPARTMENT AT [facility] Provider Note   CSN: [id number] Arrival date & time: 03/25/2024  0900     Patient presents with: unresponsive   Krista Kelley is a 68 y.o. female.   68 year old female with a history of breast mass, hypothyroidism, and NASH cirrhosis who presents to the emergency department in cardiac arrest.  History obtained per EMS.  Patient reportedly was having shortness of breath since last night.  Went to urgent care and collapsed before making it to the desk.  They performed CPR and EMS reports that after 3 rounds of epi and 1 amp of bicarb they achieved ROSC at 8:40 AM.       Prior to Admission medications   Medication Sig Start Date End Date Taking? Authorizing Provider  ampicillin  (PRINCIPEN) 500 MG capsule Take 1 capsule (500 mg total) by mouth 3 (three) times daily. Take for 10 days. 03/25/24   Early, Sara E, NP  Blood Glucose Monitoring Suppl DEVI 1 each by Does not apply route in the morning, at noon, and at bedtime. May substitute to any manufacturer covered by patient's insurance. 11/12/23   Early, Sara E, NP  celecoxib  (CELEBREX ) 200 MG capsule Take 1 capsule (200 mg total) by mouth 2 (two) times daily between meals as needed. 10/29/23   Vernetta Lonni GRADE, MD  Cetirizine HCl (ZYRTEC PO) Take by mouth.    [provider]  ciprofloxacin (CIPRO) 500 MG tablet Take 1 tablet (500 mg total) by mouth 2 (two) times daily. 03/22/24   Early, Sara E, NP  Continuous Glucose Sensor (FREESTYLE LIBRE 3 PLUS SENSOR) MISC Change sensor every 15 days. Pt. Will pay out of pocket 03/16/24   Early, Sara E, NP  EPINEPHrine  (EPIPEN  2-PAK) 0.3 mg/0.3 mL IJ SOAJ injection Inject 0.3 mg into the muscle as needed for anaphylaxis. Patient not taking: Reported on 03/16/2024 09/03/22   Oris Camie BRAVO, NP  fluconazole  (DIFLUCAN ) 150 MG tablet May repeat in 3 days if symptoms not resolved 03/22/24   Early, Sara E, NP  levothyroxine  (SYNTHROID ) 50 MCG tablet  Take 1 tablet (50 mcg total) by mouth daily. 03/16/24   Early, Sara E, NP  Milk Thistle 1000 MG CAPS Take 1 capsule by mouth daily.    [provider]  ondansetron  (ZOFRAN -ODT) 4 MG disintegrating tablet Take 1 tablet (4 mg total) by mouth every 8 (eight) hours as needed for nausea or vomiting. 03/16/24   Early, Camie BRAVO, NP  oxybutynin  (DITROPAN  XL) 10 MG 24 hr tablet Take 1 tablet (10 mg total) by mouth at bedtime. 03/16/24   Early, Sara E, NP  Probiotic Product (PROBIOTIC DAILY PO) Take 1 capsule by mouth daily.    [provider]  tirzepatide  (MOUNJARO ) 10 MG/0.5ML Pen Inject 10 mg into the skin once a week. Start after completing 4 weeks of 7.5mg  dose. 03/16/24   Early, Sara E, NP  tirzepatide  (MOUNJARO ) 5 MG/0.5ML Pen Inject 5 mg into the skin once a week. 03/16/24   Early, Sara E, NP  tirzepatide  (MOUNJARO ) 7.5 MG/0.5ML Pen Inject 7.5 mg into the skin once a week. Start after completing 4 weeks of 5mg  dose. 03/16/24   Early, Sara E, NP  venlafaxine  XR (EFFEXOR -XR) 75 MG 24 hr capsule Take 2 capsules (150 mg total) by mouth daily. 09/16/23   Early, Sara E, NP  VITAMIN D  PO Take 2,000 Int'l Units by mouth daily.    [provider]    Allergies: Other  Review of Systems  Updated Vital Signs BP (!) 157/33   Physical Exam Constitutional:      Appearance: She is obese.     Comments: GCS of 3T   Eyes:     Comments: Pupils 7 mm and fixed bilaterally   Cardiovascular:     Comments: PEA on monitor.  Compressions ongoing. Pulmonary:     Comments: LMA in place.  Rhonchorous breath sounds bilaterally.  Musculoskeletal:     Right lower leg: Edema present.     Left lower leg: Edema present.     (all labs ordered are listed, but only abnormal results are displayed) Labs Reviewed  I-STAT CHEM 8, ED - Abnormal; Notable for the following components:      Result Value   BUN 27 (*)    Glucose, Bld 150 (*)    TCO2 20 (*)    All other components within normal limits     EKG: None  Radiology: No results found.   Procedures    EMERGENCY DEPARTMENT US  CARDIAC EXAM Study: Limited Ultrasound of the Heart and Pericardium  INDICATIONS:Cardiac arrest Multiple views of the heart and pericardium were obtained in real-time with a multi-frequency probe.  PERFORMED AB:Fbdzoq IMAGES ARCHIVED?: No LIMITATIONS:  Body habitus VIEWS USED: Subcostal 4 chamber INTERPRETATION: Cardiac activity present, Pericardial effusioin absent, and Increased contractility  Medications Ordered in the ED  tenecteplase  (TNKASE ) injection for PE/MI 50 mg (50 mg Intravenous Given 03/17/2024 0914)    Clinical Course as of 03/19/2024 2102  Sat Mar 27, 2024  0944 Discussed with medical examiner from [facility]. Agree that it is likely natural causes resulting in her death.  [RP]    Clinical Course User Index [RP] [name], MD                                 Medical Decision Making Risk Prescription drug management.   68 year old female with a history of Nash cirrhosis, hypothyroidism, and breast mass who presented to the emergency department in cardiac arrest  Initial Ddx:  Hypoxic respiratory failure, volume overload, COPD, heart failure, PE, URI  MDM/Course:  Patient presents emergency department in cardiac arrest.  Appears that she was short of breath and went to urgent care and had a witnessed arrest.  Did have bystander CPR.  LMA was placed by EMS and they continued CPR and gave 3 of epinephrine .  Did briefly obtain ROSC but on arrival to the emergency department the patient again did not have a pulse so CPR was resumed.  She received a total of 10 doses of epinephrine  and 2 A of bicarbonate, 1 amp of calcium and 50 mg of TNK in case of a pulmonary embolism.  Earlier on during the code did have a bedside ultrasound that did show that the patient had hyperdynamic cardiac activity.  Briefly obtained ROSC again within the patient suffered another cardiac  arrest and unfortunately we are not able to obtain ROSC again.  Time of death was called at 9:34 AM.  Husband was brought to the bedside and was updated.  Did discuss with the medical examiner who feels it is likely due to natural causes.   This patient presents to the ED for concern of complaints listed in HPI, this involves an extensive number of treatment options, and is a complaint that carries with it a high risk of complications and morbidity. Disposition including potential need for admission considered.  Dispo: Morgue  Additional history obtained from spouse Records reviewed Outpatient Clinic Notes I personally reviewed and interpreted cardiac monitoring: Pulseless electrical activity I have reviewed the patients home medications and made adjustments as needed  Portions of this note were generated with Scientist, clinical (histocompatibility and immunogenetics). Dictation errors may occur despite best attempts at proofreading.    CRITICAL CARE Performed by: Lamar JAYSON Shan   Total critical care time: 45 minutes  Critical care time was exclusive of separately billable procedures and treating other patients.  Critical care was necessary to treat or prevent imminent or life-threatening deterioration.  Critical care was time spent personally by me on the following activities: development of treatment plan with patient and/or surrogate as well as nursing, discussions with consultants, evaluation of patient's response to treatment, examination of patient, obtaining history from patient or surrogate, ordering and performing treatments and interventions, ordering and review of laboratory studies, ordering and review of radiographic studies, pulse oximetry and re-evaluation of patient's condition.  Final diagnoses:  Cardiac arrest (HCC)  Acute on chronic respiratory failure, unspecified whether with hypoxia or hypercapnia [facility])    ED Discharge Orders     None          Shan Lamar JAYSON, MD 04/04/2024  2102

## 2024-04-06 NOTE — ED Notes (Addendum)
 Pt cleaned up, placed tag on her right big toe, other tag placed on the body bag zipper. Eyes taped closed in preparation for eye donation, placed in gown. IV's and tubes removed. Clothes thrown away per families request. Security called to take pt to the morgue.

## 2024-04-06 NOTE — ED Notes (Signed)
 Family off unit at this time, (1) pair black slip on shoes are on pt belongings, taken by spouse; family unsure of funeral home or arrangements at this time, number provided to ED and to bed placement and instructed to notify when they decide, spouse and daughter verbalized understanding

## 2024-04-06 NOTE — ED Triage Notes (Signed)
 Pt BIB [facility] CPR in progress, pt c/o SOB last night, presented to UC and collapsed before making it to the desk, CPR in progress upon EMS arrival, per EMS CPR started at 0821, ROSC back at 0840, EDP at bedside upon pt arrival, per  EMS CBG 165, capnography 40-50, HR 104,initial BP 114/60, faint pulses, pt arrives with IO and 18G IV and rcvd Epi x 3 and 1 amp bicarb PTA

## 2024-04-06 NOTE — ED Notes (Signed)
 Time of Death 0934, EDP and spouse at bedside

## 2024-04-06 DEATH — deceased

## 2024-07-16 ENCOUNTER — Encounter: Admitting: Nurse Practitioner
